# Patient Record
Sex: Female | Born: 1970 | Race: Black or African American | Hispanic: No | Marital: Single
Health system: Northeastern US, Academic
[De-identification: ages and names within clinical notes are randomized; demographics above are authoritative.]

## PROBLEM LIST (undated history)

## (undated) DIAGNOSIS — S0993XA Unspecified injury of face, initial encounter: Secondary | ICD-10-CM

## (undated) DIAGNOSIS — D219 Benign neoplasm of connective and other soft tissue, unspecified: Secondary | ICD-10-CM

## (undated) DIAGNOSIS — I10 Essential (primary) hypertension: Secondary | ICD-10-CM

---

## 2014-10-03 ENCOUNTER — Encounter (HOSPITAL_COMMUNITY): Payer: Self-pay

## 2014-10-03 ENCOUNTER — Emergency Department (INDEPENDENT_AMBULATORY_CARE_PROVIDER_SITE_OTHER)
Admission: EM | Admit: 2014-10-03 | Discharge: 2014-10-03 | Disposition: A | Discharge status: Home / Self Care | Payer: 59 | Source: Home / Self Care | Attending: Family Medicine | Admitting: Family Medicine

## 2014-10-03 DIAGNOSIS — K047 Periapical abscess without sinus: Secondary | ICD-10-CM | POA: Diagnosis not present

## 2014-10-03 DIAGNOSIS — M25562 Pain in left knee: Secondary | ICD-10-CM | POA: Diagnosis not present

## 2014-10-03 DIAGNOSIS — I10 Essential (primary) hypertension: Secondary | ICD-10-CM

## 2014-10-03 HISTORY — DX: Essential (primary) hypertension: I10

## 2014-10-03 HISTORY — DX: Benign neoplasm of connective and other soft tissue, unspecified: D21.9

## 2014-10-03 HISTORY — DX: Unspecified injury of face, initial encounter: S09.93XA

## 2014-10-03 LAB — POCT I-STAT, CHEM 8
BUN: 6 mg/dL (ref 6–20)
CALCIUM ION: 1.18 mmol/L (ref 1.12–1.23)
CHLORIDE: 103 mmol/L (ref 101–111)
Creatinine, Ser: 0.8 mg/dL (ref 0.44–1.00)
GLUCOSE: 90 mg/dL (ref 65–99)
HCT: 38 % (ref 36.0–46.0)
Hemoglobin: 12.9 g/dL (ref 12.0–15.0)
POTASSIUM: 3.8 mmol/L (ref 3.5–5.1)
SODIUM: 141 mmol/L (ref 135–145)
TCO2: 23 mmol/L (ref 0–100)

## 2014-10-03 MED ORDER — FLUCONAZOLE 150 MG PO TABS: 150.0000 mg | ORAL_TABLET | Freq: Every day | ORAL | Status: AC

## 2014-10-03 MED ORDER — HYDROCHLOROTHIAZIDE 12.5 MG PO TABS: 12.5000 mg | ORAL_TABLET | Freq: Every day | ORAL | Status: AC

## 2014-10-03 MED ORDER — CLINDAMYCIN HCL 300 MG PO CAPS: 300.0000 mg | ORAL_CAPSULE | Freq: Three times a day (TID) | ORAL | Status: AC

## 2014-10-03 MED ORDER — MELOXICAM 15 MG PO TABS: 7.5000 mg | ORAL_TABLET | Freq: Every day | ORAL | Status: AC

## 2014-10-03 NOTE — Discharge Instructions (Signed)
Please take the antibiotics as prescribed Please use the yeast medicine if it gives you a yeast infection Please use the meloxicam for knee pain and dental pain Please use a knee brace Please start the blood pressure medication
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
## 2014-10-03 NOTE — ED Notes (Signed)
C/o pain in tooth x 1 week; pain and swelling left knee x 1 week. States recently relocated from [place] . Off of her BP medication

## 2014-11-05 ENCOUNTER — Emergency Department (HOSPITAL_COMMUNITY)
Admission: EM | Admit: 2014-11-05 | Discharge: 2014-11-05 | Disposition: A | Discharge status: Home / Self Care | Payer: 59 | Source: Home / Self Care | Attending: Family Medicine | Admitting: Family Medicine

## 2014-11-05 ENCOUNTER — Encounter (HOSPITAL_COMMUNITY): Payer: Self-pay | Admitting: *Deleted

## 2014-11-05 DIAGNOSIS — I10 Essential (primary) hypertension: Secondary | ICD-10-CM | POA: Diagnosis not present

## 2014-11-05 MED ORDER — LISINOPRIL-HYDROCHLOROTHIAZIDE 10-12.5 MG PO TABS: 1.0000 | ORAL_TABLET | Freq: Every day | ORAL | Status: AC

## 2014-11-05 NOTE — ED Notes (Signed)
Pt   Reports  She has  Ran out of     Her blood   Pressure         sev  Days  Ago   She  Reports  She  Took      Her   Last  Pill  Today        She  Also  Reports  l       Arm s  Sore  As    Well
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
## 2014-11-05 NOTE — Discharge Instructions (Signed)
See your doctor for bp follow-up and refill of medicine.

## 2014-11-19 ENCOUNTER — Other Ambulatory Visit: Payer: Self-pay | Admitting: Family Medicine

## 2014-11-19 DIAGNOSIS — N63 Unspecified lump in unspecified breast: Secondary | ICD-10-CM

## 2014-11-26 ENCOUNTER — Ambulatory Visit
Admission: RE | Admit: 2014-11-26 | Discharge: 2014-11-26 | Disposition: A | Discharge status: Home / Self Care | Payer: 59 | Source: Ambulatory Visit | Attending: Family Medicine | Admitting: Family Medicine

## 2014-11-26 ENCOUNTER — Other Ambulatory Visit: Payer: Self-pay | Admitting: Family Medicine

## 2014-11-26 DIAGNOSIS — N63 Unspecified lump in unspecified breast: Secondary | ICD-10-CM

## 2015-11-23 ENCOUNTER — Other Ambulatory Visit: Payer: Self-pay | Admitting: Family Medicine

## 2015-11-23 DIAGNOSIS — N926 Irregular menstruation, unspecified: Secondary | ICD-10-CM

## 2015-11-23 DIAGNOSIS — D259 Leiomyoma of uterus, unspecified: Secondary | ICD-10-CM

## 2015-12-02 ENCOUNTER — Other Ambulatory Visit: Payer: 59

## 2015-12-07 ENCOUNTER — Ambulatory Visit
Admission: RE | Admit: 2015-12-07 | Discharge: 2015-12-07 | Disposition: A | Discharge status: Home / Self Care | Payer: 59 | Source: Ambulatory Visit | Attending: Family Medicine | Admitting: Family Medicine

## 2015-12-07 DIAGNOSIS — D259 Leiomyoma of uterus, unspecified: Secondary | ICD-10-CM

## 2015-12-07 DIAGNOSIS — N926 Irregular menstruation, unspecified: Secondary | ICD-10-CM

## 2015-12-21 ENCOUNTER — Other Ambulatory Visit: Payer: Self-pay | Admitting: Family Medicine

## 2015-12-21 DIAGNOSIS — N83202 Unspecified ovarian cyst, left side: Secondary | ICD-10-CM

## 2016-02-01 ENCOUNTER — Other Ambulatory Visit: Payer: 59

## 2016-02-29 ENCOUNTER — Other Ambulatory Visit: Payer: 59

## 2016-02-29 ENCOUNTER — Inpatient Hospital Stay: Admission: RE | Admit: 2016-02-29 | Payer: 59 | Source: Ambulatory Visit

## 2016-03-24 ENCOUNTER — Ambulatory Visit
Admission: RE | Admit: 2016-03-24 | Discharge: 2016-03-24 | Disposition: A | Discharge status: Home / Self Care | Payer: 59 | Source: Ambulatory Visit | Attending: Family Medicine | Admitting: Family Medicine

## 2016-03-24 ENCOUNTER — Other Ambulatory Visit: Payer: Self-pay | Admitting: Family Medicine

## 2016-03-24 DIAGNOSIS — M792 Neuralgia and neuritis, unspecified: Secondary | ICD-10-CM

## 2016-03-30 ENCOUNTER — Ambulatory Visit
Admission: RE | Admit: 2016-03-30 | Discharge: 2016-03-30 | Disposition: A | Discharge status: Home / Self Care | Payer: 59 | Source: Ambulatory Visit | Attending: Family Medicine | Admitting: Family Medicine

## 2016-03-30 DIAGNOSIS — N83202 Unspecified ovarian cyst, left side: Secondary | ICD-10-CM

## 2017-06-19 IMAGING — US US TRANSVAGINAL NON-OB
1 series · 13 of 25 positions shown · non-contrast
Comparison: 12/07/2015

CLINICAL DATA: Prolonged heavy bleeding for 2 months. On birth
control pills. Left ovarian cyst. Fibroids.



[Series 1: us transvaginal non-ob · 0.25mm/px · 13 of 27 slices shown]
[im 1/27]
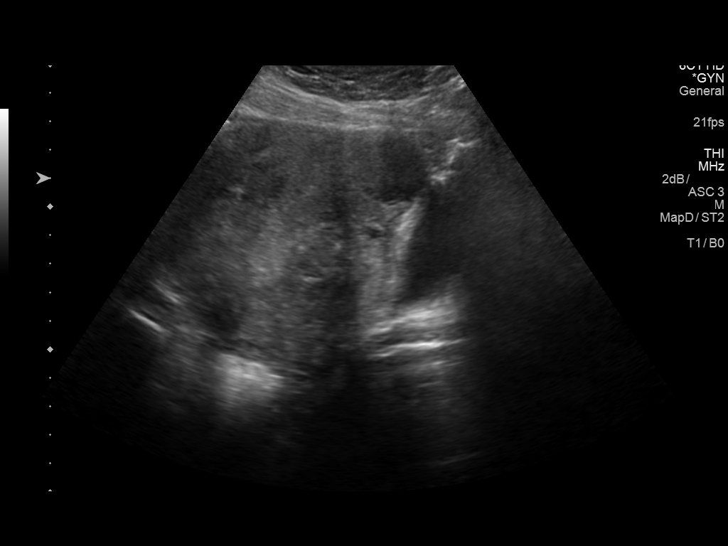
[im 3/27]
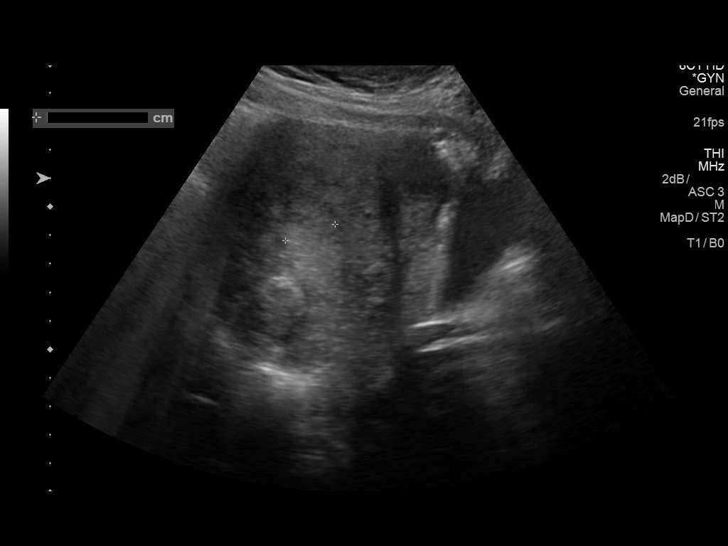
[im 5/27]
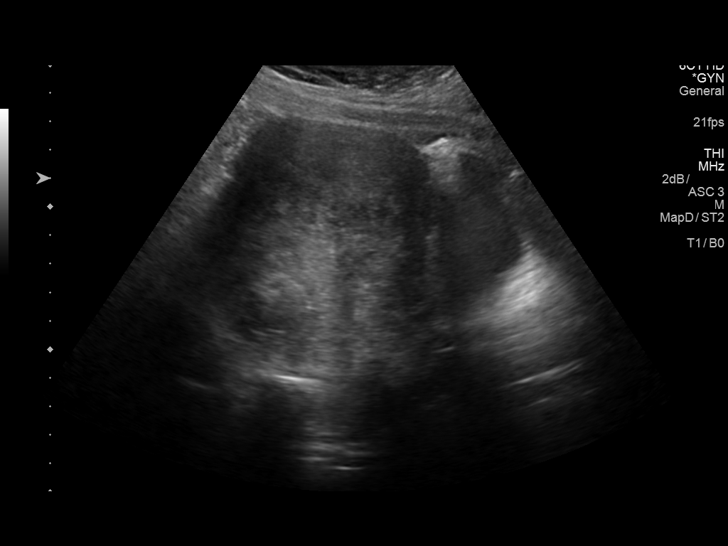
[im 7/27]
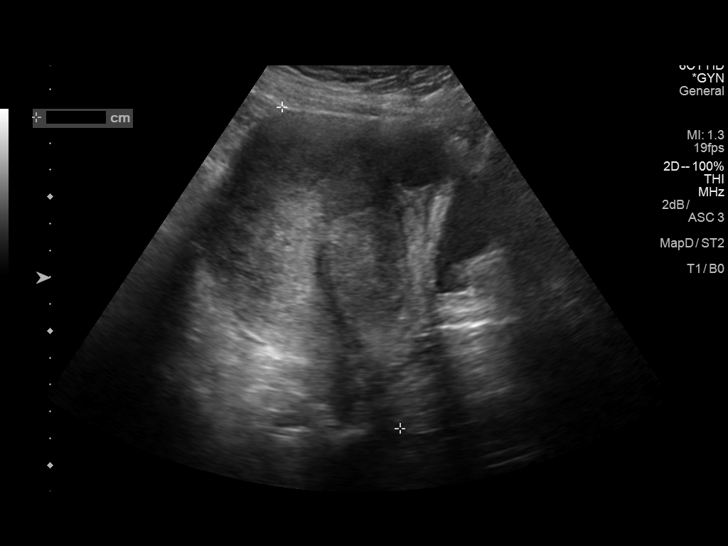
[im 9/27]
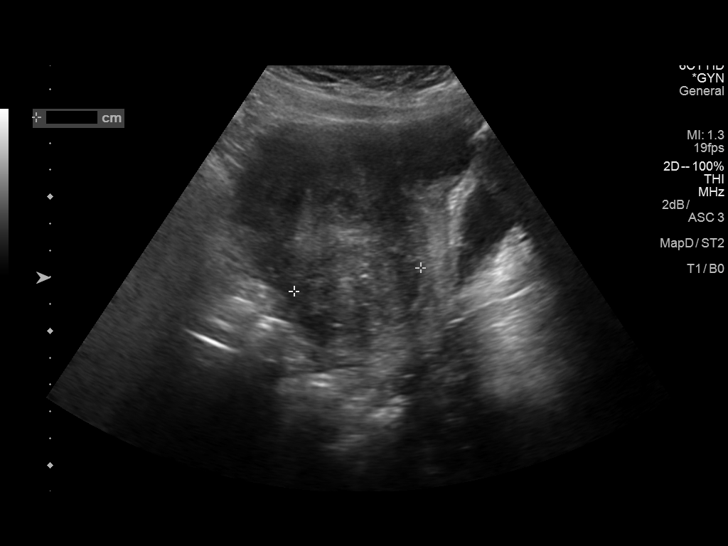
[im 11/27]
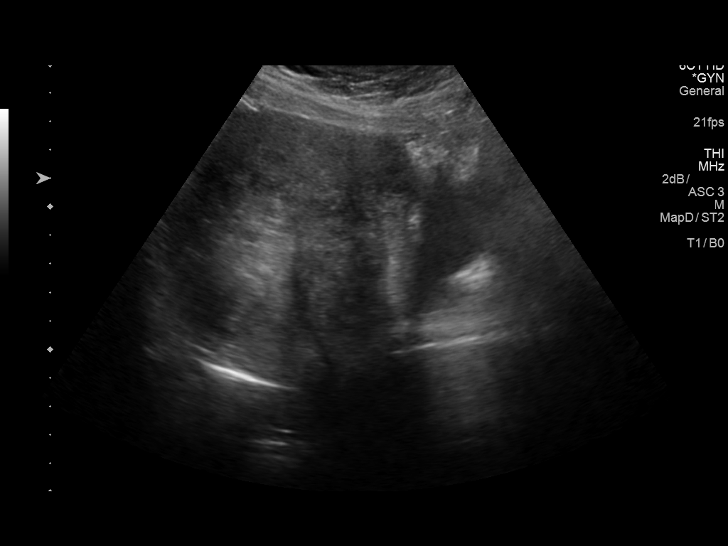
[im 14/27]
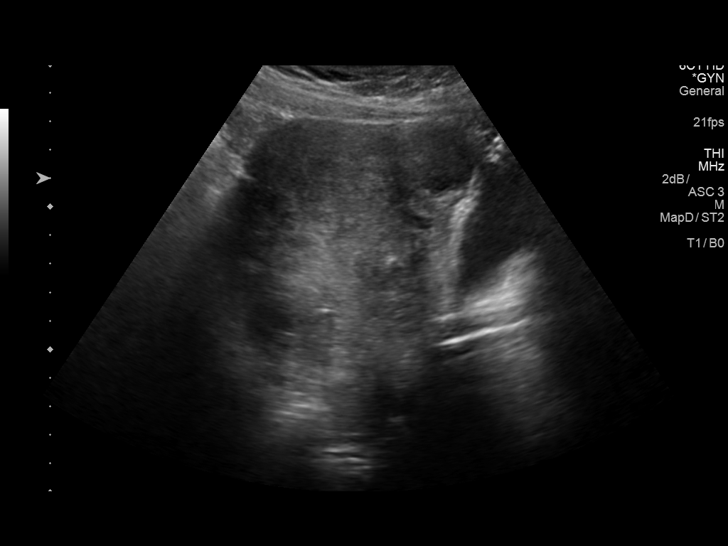
[im 16/27]
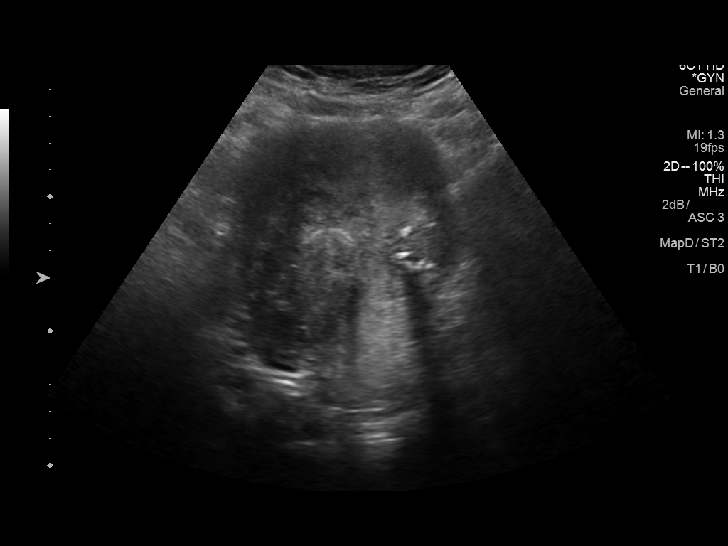
[im 18/27]
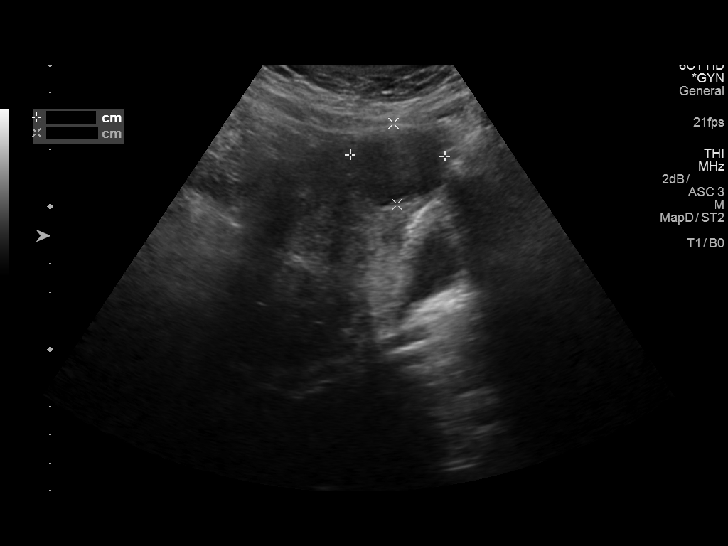
[im 20/27]
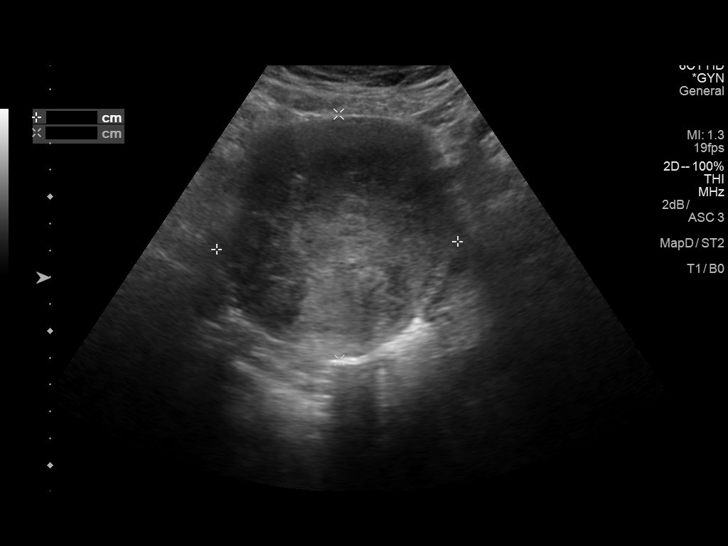
[im 22/27]
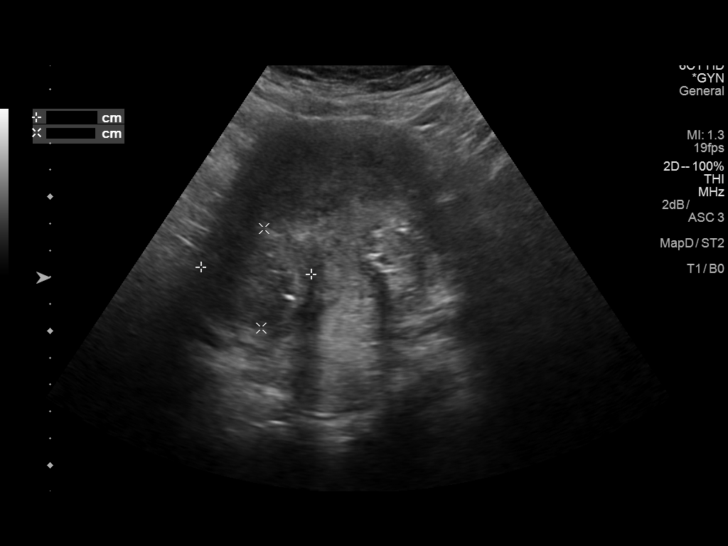
[im 24/27]
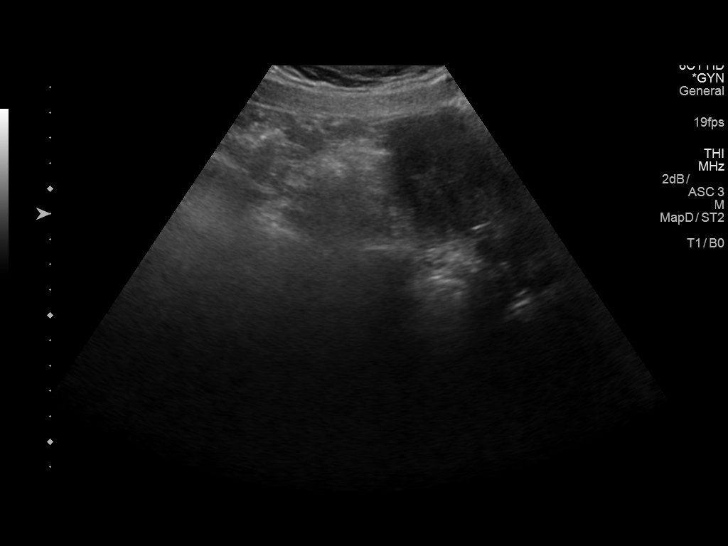
[im 27/27]
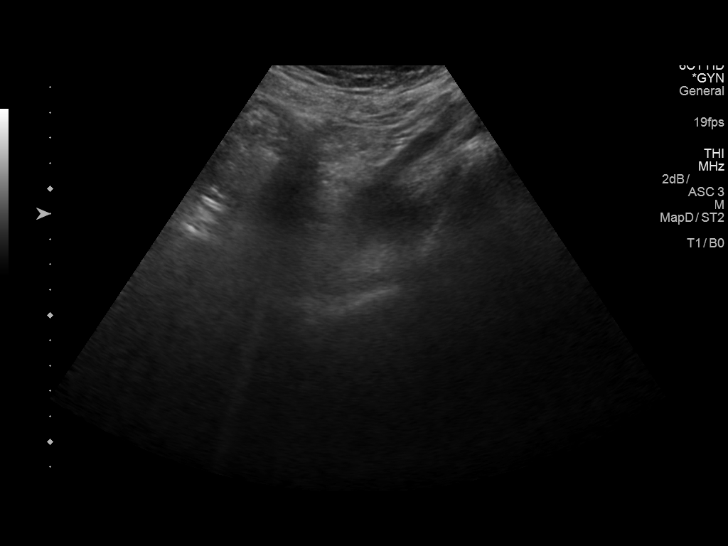

[13 of 25 positions shown; findings below may reference images not displayed]

FINDINGS: Uterus

Measurements: 13 x 9 x 9 cm. Enlargement is due to multiple
heterogeneous masses consistent with fibroids. Most of these are
intramural, and measure up to 4 cm in the posterior body. There is a
mucosal based mass measuring up to 25 mm. Although there is central
vascularity, echotexture with shadowing focus favors fibroid over
polyp.

Endometrium

Most of the endometrium is distorted by a fibroids. Where not
thicken by the above-described mucosal based mass there is no
generalized thickening. Endometrium measures up to 8 mm.

Right ovary

Measurements: 24 x 13 x 15 mm.  Normal appearance.

Left ovary

Measurements: 32 x 21 x 24 mmHemorrhagic cyst seen previously has
resolved/simplified. There is a thick walled 20 x 14 mm cyst/
follicle in the left ovary most consistent with a corpus luteum. No
evidence of solid mass.

Other findings

No abnormal free fluid.
IMPRESSION: 1. Enlarged multi fibroid uterus. 25 mm mucosal based mass has
features favoring submucosal fibroid over polyp.
2. Resolved left ovarian hemorrhagic cyst. Left-sided cyst/follicle
is likely a corpus luteum.

## 2017-06-19 IMAGING — US US TRANSVAGINAL NON-OB
1 series · 13 of 25 positions shown · non-contrast
Comparison: 12/07/2015

CLINICAL DATA: Prolonged heavy bleeding for 2 months. On birth
control pills. Left ovarian cyst. Fibroids.



[Series 1: us transvaginal non-ob · 0.16mm/px · 13 of 52 slices shown]
[im 1/52]
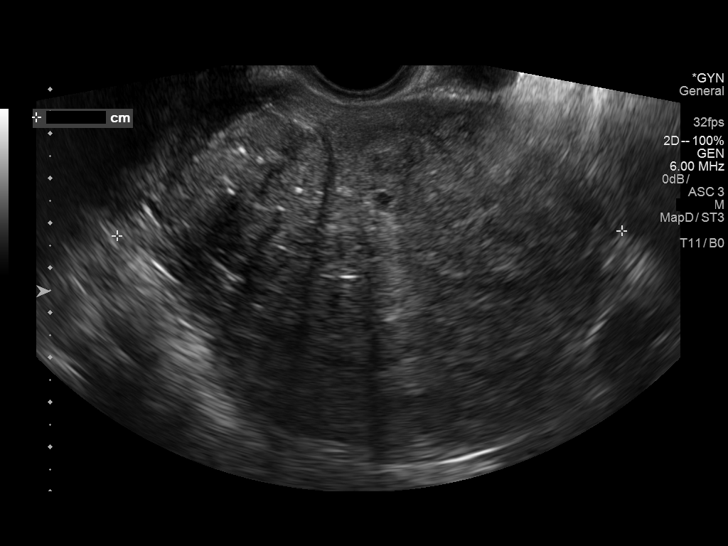
[im 5/52]
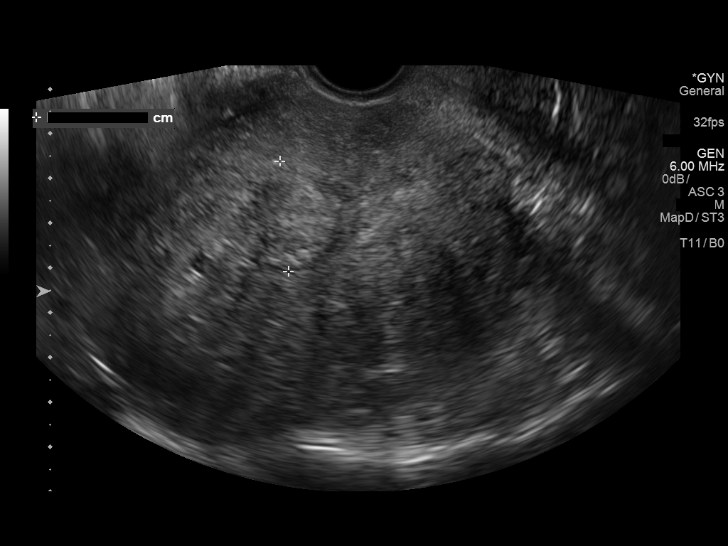
[im 9/52]
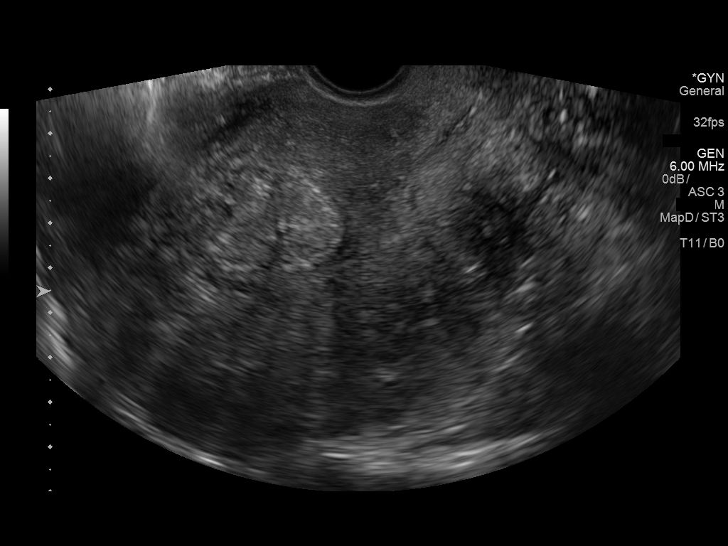
[im 13/52]
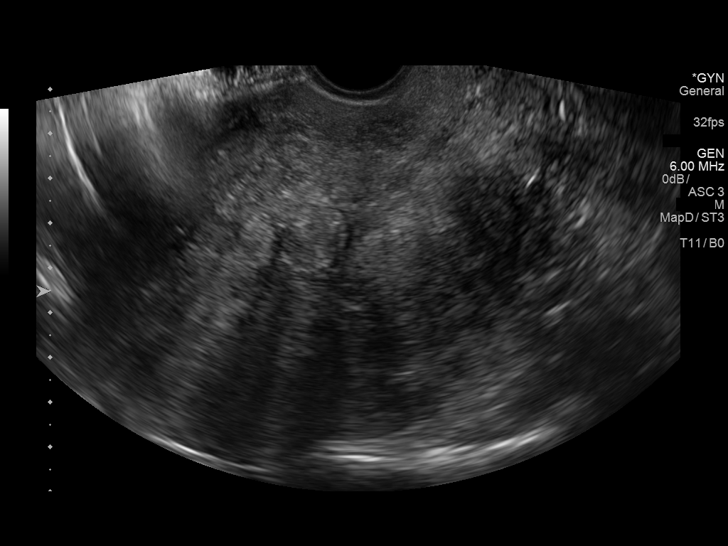
[im 18/52]
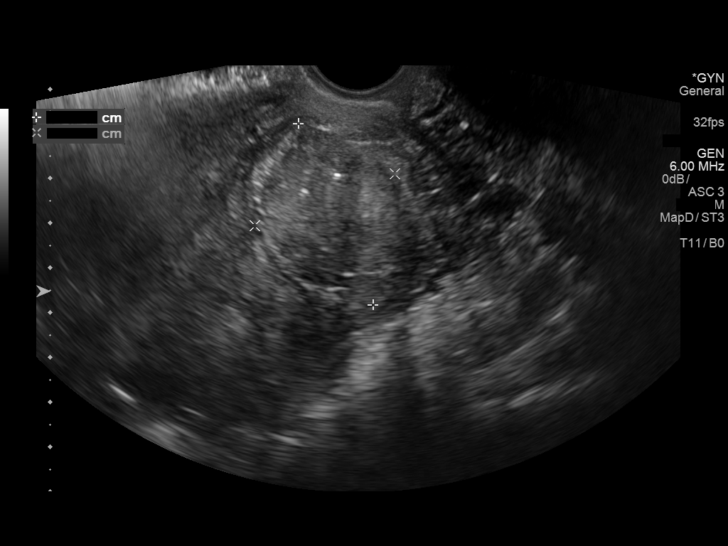
[im 22/52]
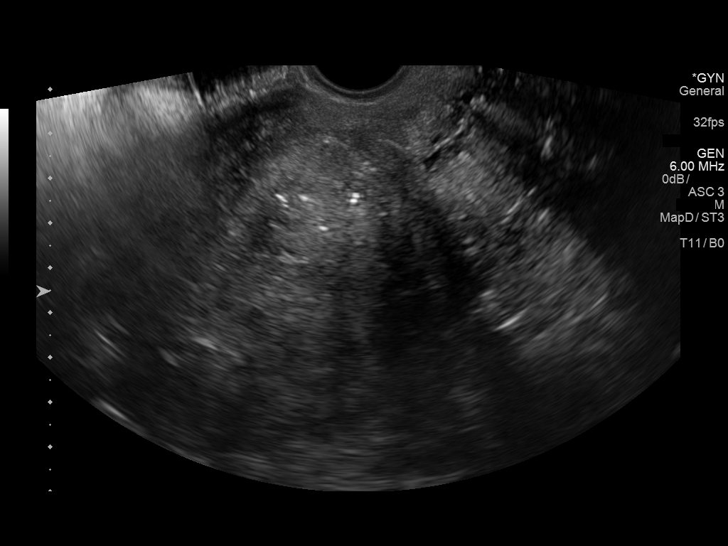
[im 26/52]
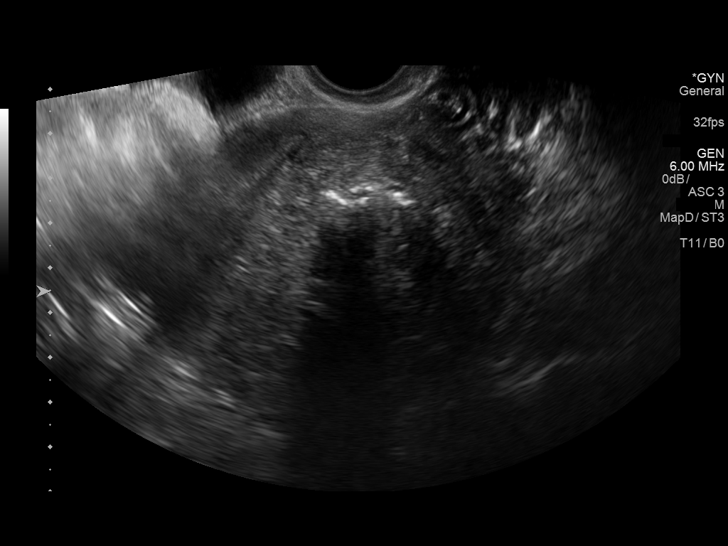
[im 30/52]
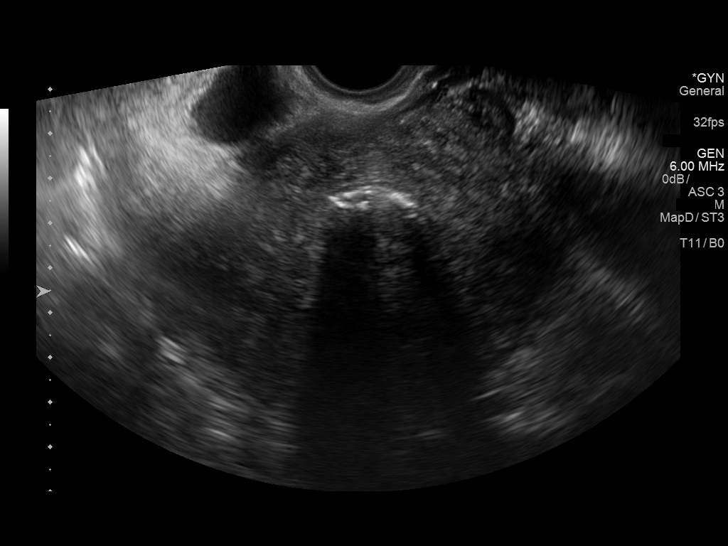
[im 35/52]
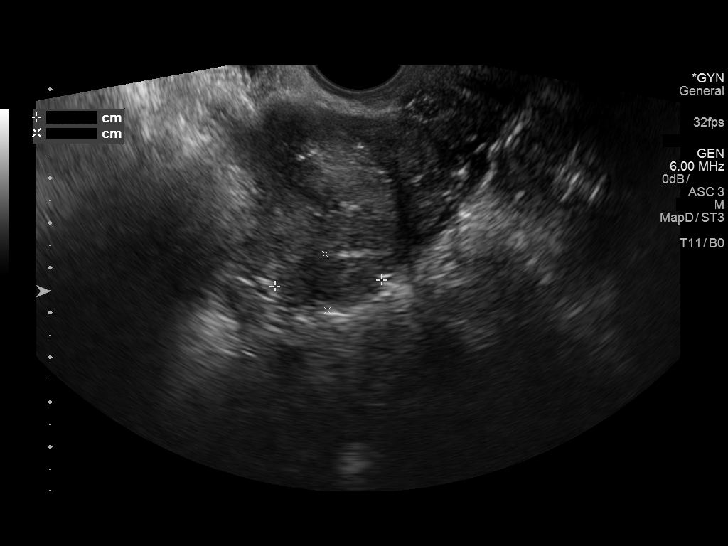
[im 39/52]
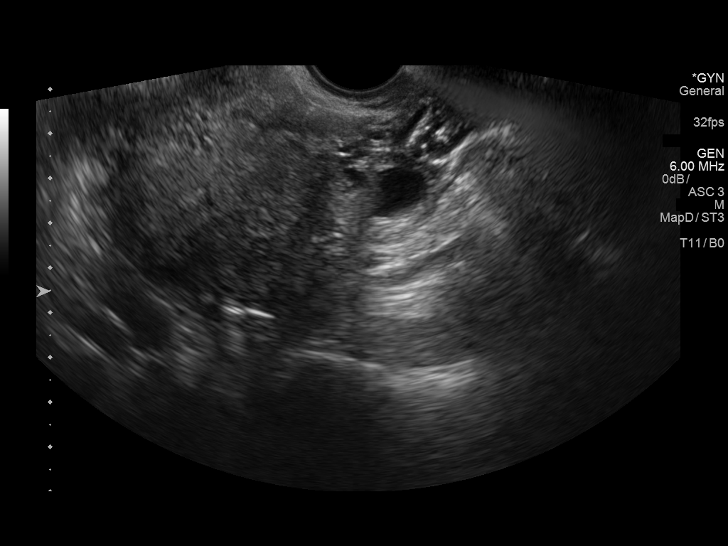
[im 43/52]
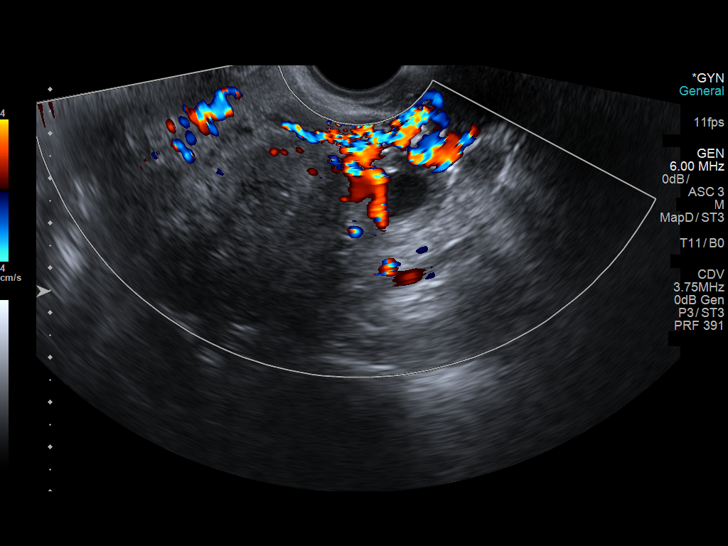
[im 47/52]
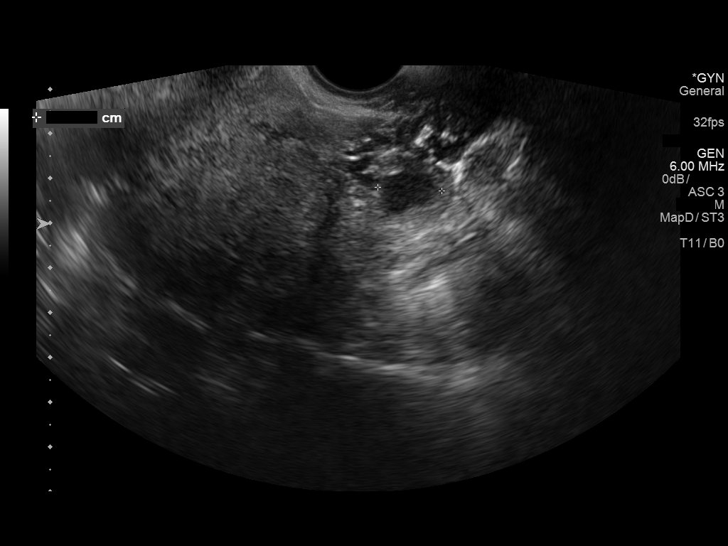
[im 52/52]
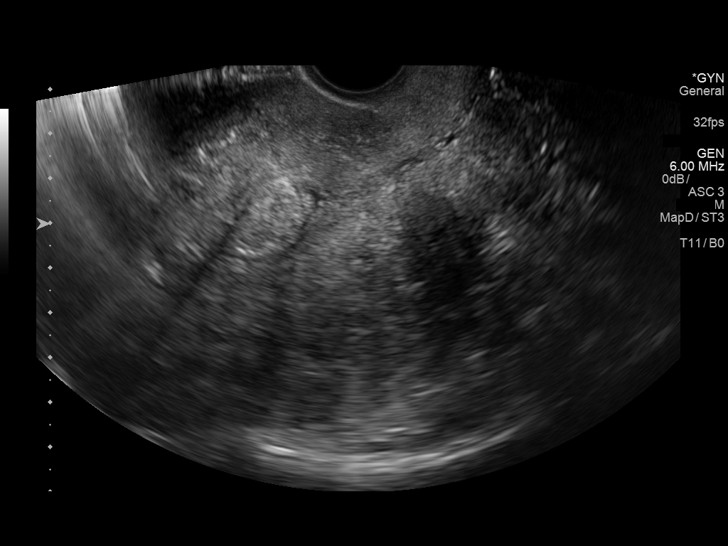

[13 of 25 positions shown; findings below may reference images not displayed]

FINDINGS: Uterus

Measurements: 13 x 9 x 9 cm. Enlargement is due to multiple
heterogeneous masses consistent with fibroids. Most of these are
intramural, and measure up to 4 cm in the posterior body. There is a
mucosal based mass measuring up to 25 mm. Although there is central
vascularity, echotexture with shadowing focus favors fibroid over
polyp.

Endometrium

Most of the endometrium is distorted by a fibroids. Where not
thicken by the above-described mucosal based mass there is no
generalized thickening. Endometrium measures up to 8 mm.

Right ovary

Measurements: 24 x 13 x 15 mm.  Normal appearance.

Left ovary

Measurements: 32 x 21 x 24 mmHemorrhagic cyst seen previously has
resolved/simplified. There is a thick walled 20 x 14 mm cyst/
follicle in the left ovary most consistent with a corpus luteum. No
evidence of solid mass.

Other findings

No abnormal free fluid.
IMPRESSION: 1. Enlarged multi fibroid uterus. 25 mm mucosal based mass has
features favoring submucosal fibroid over polyp.
2. Resolved left ovarian hemorrhagic cyst. Left-sided cyst/follicle
is likely a corpus luteum.

## 2020-09-06 ENCOUNTER — Emergency Department: Admit: 2020-09-06 | Payer: PRIVATE HEALTH INSURANCE

## 2020-09-06 ENCOUNTER — Inpatient Hospital Stay: Admit: 2020-09-06 | Discharge: 2020-09-06 | Payer: PRIVATE HEALTH INSURANCE

## 2020-09-06 DIAGNOSIS — M25561 Pain in right knee: Secondary | ICD-10-CM

## 2020-09-06 DIAGNOSIS — M542 Cervicalgia: Principal | ICD-10-CM

## 2020-09-06 DIAGNOSIS — J45909 Unspecified asthma, uncomplicated: Secondary | ICD-10-CM

## 2020-09-06 DIAGNOSIS — Z87891 Personal history of nicotine dependence: Secondary | ICD-10-CM

## 2020-09-06 DIAGNOSIS — E7800 Pure hypercholesterolemia, unspecified: Secondary | ICD-10-CM

## 2020-09-06 DIAGNOSIS — S161XXA Strain of muscle, fascia and tendon at neck level, initial encounter: Secondary | ICD-10-CM

## 2020-09-06 DIAGNOSIS — I10 Essential (primary) hypertension: Secondary | ICD-10-CM

## 2020-09-06 DIAGNOSIS — M069 Rheumatoid arthritis, unspecified: Secondary | ICD-10-CM

## 2020-09-06 DIAGNOSIS — Z791 Long term (current) use of non-steroidal anti-inflammatories (NSAID): Secondary | ICD-10-CM

## 2020-09-06 DIAGNOSIS — M25562 Pain in left knee: Secondary | ICD-10-CM

## 2020-09-06 DIAGNOSIS — Z79899 Other long term (current) drug therapy: Secondary | ICD-10-CM

## 2020-09-06 MED ORDER — CYCLOBENZAPRINE 10 MG TABLET
10 mg | ORAL_TABLET | Freq: Two times a day (BID) | ORAL | 1 refills | Status: AC | PRN
Start: 2020-09-06 — End: ?

## 2020-09-06 NOTE — Discharge Instructions
Return if worse
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
## 2021-09-16 ENCOUNTER — Emergency Department: Admit: 2021-09-16 | Payer: PRIVATE HEALTH INSURANCE

## 2021-09-16 ENCOUNTER — Inpatient Hospital Stay: Admit: 2021-09-16 | Discharge: 2021-09-16 | Payer: PRIVATE HEALTH INSURANCE | Attending: Emergency Medicine

## 2021-09-16 DIAGNOSIS — M79605 Pain in left leg: Secondary | ICD-10-CM

## 2021-09-16 MED ORDER — ACETAMINOPHEN 325 MG TABLET
325 mg | Freq: Once | ORAL | Status: DC
Start: 2021-09-16 — End: 2021-09-17

## 2021-09-16 MED ORDER — ACETAMINOPHEN 325 MG TABLET
325 mg | Freq: Once | ORAL | Status: CP
Start: 2021-09-16 — End: ?
  Administered 2021-09-17: 01:00:00 325 mg via ORAL

## 2021-09-17 DIAGNOSIS — Z881 Allergy status to other antibiotic agents status: Secondary | ICD-10-CM

## 2021-09-17 DIAGNOSIS — Z880 Allergy status to penicillin: Secondary | ICD-10-CM

## 2021-09-17 DIAGNOSIS — Z86718 Personal history of other venous thrombosis and embolism: Secondary | ICD-10-CM

## 2021-09-17 DIAGNOSIS — I10 Essential (primary) hypertension: Secondary | ICD-10-CM

## 2021-09-17 DIAGNOSIS — Z79899 Other long term (current) drug therapy: Secondary | ICD-10-CM

## 2021-09-17 DIAGNOSIS — Z888 Allergy status to other drugs, medicaments and biological substances status: Secondary | ICD-10-CM

## 2021-09-17 DIAGNOSIS — M79604 Pain in right leg: Secondary | ICD-10-CM

## 2021-09-17 NOTE — ED Notes
6:46 PM Pt presents to ED w/ leg cramping.  Pt reports 3 days of LLE cramping, mainly in the popliteal area.  Pt reports a hx of blood clots, reports this feeling is similar.  Pt states she has intermittent pain while ambulating and last night was woken up in the middle of night d/t intolerable aching pain.  Pt is not on blood thinners.  Denies chest pain and SOB.Past Medical History: Diagnosis Date  Asthma   Last attack was when she was 78. She takes albuterol prn.  Hypercholesteremia   Hypertension   Radiculopathy   Rheumatoid arthritis (HC Code)   Rheumatoid arthritis (HC Code)   Rheumatoid arthritis involving ankle, unspecified laterality, unspecified rheumatoid factor presence   Rheumatoid arthritis involving ankle, unspecified laterality, unspecified rheumatoid factor presence   Thrombophlebitis   Uterine fibroid  9:00 PM Pt resting in stretcher.  Reporting of pain behind R knee.  Medicated for pain per MAR.  Denies additional complaints at this time.  Pending [place].10:40 PM [place] completed.  Pt cleared for discharge by provider.  Pt reports improvement in pain.  Vitals obtained.  Discharge papers and instructions given by this RN.  Ambulatory w/ steady gait upon discharge.

## 2021-09-17 NOTE — Discharge Instructions
We did not find a blood clot in your legs. If you have continued pain and swelling you can follow up with a primary physician. Since there is no primary listed on your file, we placed an order for a referral and you should receive a call to coordinate a primary healthcare worker for you.

## 2021-09-17 NOTE — ED Provider Notes
Chief Complaint Patient presents with ? Leg Pain   L leg pain and swelling x 2 days.  Hx of clots in her legs, thinks she has another.  Not on thinners.    Hypertensive at triage, off meds for HTN. HPI/PE:51 y/o female w history of DVT presents to ED with right LE pain. Denies SOB, fever, chills. Has history of DVT, denies recent travel/immobilization. Mild swelling in popliteal fossa w mild ttpDiff dx includes baker cyst vs DVT, will obtain right LE [place].   Physical ExamED Triage Vitals [09/16/21 1828]BP: (!) 171/114Pulse: 89Pulse from  O2 sat: n/aResp: 18Temp: 98.3 ?F (36.8 ?C)Temp src: TemporalSpO2: 98 % BP (!) 171/114  - Pulse 89  - Temp 98.3 ?F (36.8 ?C) (Temporal)  - Resp 18  - SpO2 98% Physical ExamConstitutional:     General: She is not in acute distress.   Appearance: Normal appearance. HENT:    Head: Normocephalic and atraumatic. Eyes:    Conjunctiva/sclera: Conjunctivae normal. Cardiovascular:    Rate and Rhythm: Normal rate. Pulmonary:    Effort: Pulmonary effort is normal. No respiratory distress.    Breath sounds: Normal breath sounds. Abdominal:    General: There is no distension. Musculoskeletal:       General: No deformity. Skin:   General: Skin is warm and dry. Neurological:    Mental Status: She is alert. Psychiatric:       Behavior: Behavior normal.  ProceduresAttestation/Critical CarePatient Reevaluation: 10:33 PMInitially left DVT US was ordered by mistake. Right LE DVT study is negative. Will discharge from ED. Sherrilyn Rist, MDClinical Impressions as of 09/16/21 2235 Right leg swelling  ED DispositionDischarge Sherrilyn Rist, MD09/14/23 2235

## 2021-09-17 NOTE — Telephone Encounter
Message from ED provider requesting Zoe Riddle be contacted and assisted with establishing primary care.  Spoke with her and she was provided with the contact # for the [facility] as she is uninsured and is not eligible for [facility] at this time (reported by [name]). She was also encouraged to complete the Free Care application. All questions were answered and she was appreciative of the call.

## 2022-05-10 ENCOUNTER — Emergency Department: Admit: 2022-05-10 | Payer: PRIVATE HEALTH INSURANCE

## 2022-05-10 ENCOUNTER — Encounter: Admit: 2022-05-10 | Payer: PRIVATE HEALTH INSURANCE | Attending: Emergency Medicine

## 2022-05-10 ENCOUNTER — Inpatient Hospital Stay: Admit: 2022-05-10 | Discharge: 2022-05-10 | Payer: PRIVATE HEALTH INSURANCE | Attending: Emergency Medicine

## 2022-05-10 DIAGNOSIS — I10 Essential (primary) hypertension: Secondary | ICD-10-CM

## 2022-05-10 DIAGNOSIS — E7800 Pure hypercholesterolemia, unspecified: Secondary | ICD-10-CM

## 2022-05-10 DIAGNOSIS — R42 Dizziness and giddiness: Secondary | ICD-10-CM

## 2022-05-10 DIAGNOSIS — I2102 ST elevation (STEMI) myocardial infarction involving left anterior descending coronary artery: Secondary | ICD-10-CM

## 2022-05-10 DIAGNOSIS — I809 Phlebitis and thrombophlebitis of unspecified site: Secondary | ICD-10-CM

## 2022-05-10 DIAGNOSIS — M5410 Radiculopathy, site unspecified: Secondary | ICD-10-CM

## 2022-05-10 DIAGNOSIS — D259 Leiomyoma of uterus, unspecified: Secondary | ICD-10-CM

## 2022-05-10 DIAGNOSIS — M069 Rheumatoid arthritis, unspecified: Secondary | ICD-10-CM

## 2022-05-10 DIAGNOSIS — J45909 Unspecified asthma, uncomplicated: Secondary | ICD-10-CM

## 2022-05-10 LAB — BASIC METABOLIC PANEL
BKR AMPHETAMINE SCREEN, URINE, NO CONF.: >60 mL/min/1.73m2 (ref >=60)
BKR ANION GAP: 10 (ref 7–17)
BKR BENZODIAZEPINES SCREEN, URINE, NO CONF.: 23 mmol/L (ref 20–30)
BKR BLOOD UREA NITROGEN: 12 mg/dL (ref 6–20)
BKR BUN / CREAT RATIO: 15.6 (ref 8.0–23.0)
BKR CALCIUM: 9.4 mg/dL (ref 8.8–10.2)
BKR CHLORIDE: 105 mmol/L (ref 98–107)
BKR CO2: 23 mmol/L (ref 20–30)
BKR CREATININE: 0.77 mg/dL (ref 0.40–1.30)
BKR EGFR, CREATININE (CKD-EPI 2021): >60 mL/min/{1.73_m2} (ref >=60)
BKR GLUCOSE: 106 mg/dL — ABNORMAL HIGH (ref 70–100)
BKR PHENCYCLIDINE (PCP) SCREEN, URINE, NO CONF.: 15.6 (ref 8.0–23.0)
BKR POTASSIUM: 3.9 mmol/L (ref 3.3–5.3)
BKR SODIUM: 138 mmol/L (ref 136–144)

## 2022-05-10 LAB — CBC WITHOUT DIFFERENTIAL
BKR WAM ANALYZER ANC: 3.21 x 1000/??L (ref 2.00–7.60)
BKR WAM HEMATOCRIT (2 DEC): 41.2 % (ref 35.00–45.00)
BKR WAM HEMOGLOBIN: 13.3 g/dL (ref 11.7–15.5)
BKR WAM MCH (PG): 26.3 pg — ABNORMAL LOW (ref 27.0–33.0)
BKR WAM MCHC: 32.3 g/dL (ref 31.0–36.0)
BKR WAM MCV: 81.4 fL (ref 80.0–100.0)
BKR WAM MPV: 9.5 fL (ref 8.0–12.0)
BKR WAM PLATELETS: 343 x1000/??L (ref 150–420)
BKR WAM RDW-CV: 13.6 % (ref 11.0–15.0)
BKR WAM RED BLOOD CELL COUNT.: 5.06 M/ÂµL (ref 4.00–6.00)
BKR WAM WHITE BLOOD CELL COUNT: 6.6 x1000/ÂµL (ref 4.0–11.0)

## 2022-05-10 LAB — TROPONIN T HIGH SENSITIVITY, 1 HOUR WITH REFLEX (BH GH LMW YH)
BKR TROPONIN T HS 1 HOUR DELTA FROM 0 HOUR: 1 ng/L (ref 5.5–7.5)
BKR TROPONIN T HS 1 HOUR: 8 ng/L

## 2022-05-10 LAB — URINE DRUG SCREEN W/ NO CONF   (BH GH LMH YH)
BKR BARBITURATE SCREEN, URINE, NO CONF.: NEGATIVE
BKR CANNABINOIDS SCREEN, URINE, NO CONF.: NEGATIVE
BKR COCAINE SCREEN, URINE, NO CONF.: NEGATIVE
BKR METHADONE METABOLITE SCREEN, URINE, NO CONF.: NEGATIVE
BKR OPIATES SCREEN, URINE, NO CONF.: NEGATIVE
BKR OXYCODONE SCREEN, URINE, NO CONF.: NEGATIVE

## 2022-05-10 LAB — PROTIME AND INR
BKR INR: 1.04 (ref 0.86–1.12)
BKR PROTHROMBIN TIME: 11.5 s (ref 9.6–12.3)

## 2022-05-10 LAB — PARTIAL THROMBOPLASTIN TIME     (BH GH LMW Q YH): BKR PARTIAL THROMBOPLASTIN TIME: 22.5 seconds — ABNORMAL LOW (ref 23.0–31.4)

## 2022-05-10 LAB — UA REFLEX CULTURE

## 2022-05-10 LAB — URINALYSIS WITH CULTURE REFLEX      (BH LMW YH)
BKR BILIRUBIN, UA: NEGATIVE
BKR BLOOD, UA: NEGATIVE
BKR GLUCOSE, UA: NEGATIVE
BKR KETONES, UA: NEGATIVE
BKR LEUKOCYTE ESTERASE, UA: NEGATIVE
BKR NITRITE, UA: NEGATIVE
BKR PH, UA: 7.5 (ref 5.5–7.5)
BKR PROTEIN, UA: NEGATIVE
BKR SPECIFIC GRAVITY, UA: 1.04 ng/L — ABNORMAL HIGH (ref 1.005–1.030)
BKR UROBILINOGEN, UA (MG/DL): <2 mg/dL (ref <=2.0)

## 2022-05-10 LAB — TROPONIN T HIGH SENSITIVITY, 0 HOUR BASELINE WITH REFLEX (BH GH LMW YH): BKR TROPONIN T HS 0 HOUR BASELINE: 7 ng/L

## 2022-05-10 LAB — BUPRENORPHINE, URINE, WITH NO CONFIRMATION   (BH GH LMW YH): BKR BUPRENORPHINE SCREEN: NEGATIVE

## 2022-05-10 LAB — FENTANYL, URINE, WITH NO CONFIRMATION (BH GH L LMW YH)
BKR DRUGS OF ABUSE NOTE: NEGATIVE mg/dL — ABNORMAL HIGH (ref 70–100)
BKR FENTANYL: NEGATIVE (ref 7–17)

## 2022-05-10 MED ORDER — METOPROLOL SUCCINATE ER 25 MG TABLET,EXTENDED RELEASE 24 HR
25 mg | Freq: Every day | ORAL | Status: DC
Start: 2022-05-10 — End: 2022-05-11

## 2022-05-10 MED ORDER — LISINOPRIL 20 MG TABLET
20 mg | Freq: Every day | ORAL | Status: DC
Start: 2022-05-10 — End: 2022-05-11

## 2022-05-10 MED ORDER — LORAZEPAM 0.5 MG TABLET
0.5 mg | Freq: Once | ORAL | Status: DC | PRN
Start: 2022-05-10 — End: 2022-05-10

## 2022-05-10 MED ORDER — ROSUVASTATIN 40 MG TABLET
40 mg | Freq: Every day | ORAL | Status: DC
Start: 2022-05-10 — End: 2022-05-11

## 2022-05-10 MED ORDER — SODIUM CHLORIDE 0.9 % LARGE VOLUME SYRINGE FOR AUTOINJECTOR
Freq: Once | INTRAVENOUS | Status: CP | PRN
Start: 2022-05-10 — End: ?
  Administered 2022-05-10: 15:00:00 via INTRAVENOUS

## 2022-05-10 MED ORDER — MECLIZINE 25 MG TABLET
25 mg | Freq: Once | ORAL | Status: CP
Start: 2022-05-10 — End: ?
  Administered 2022-05-11: 25 mg via ORAL

## 2022-05-10 MED ORDER — IOHEXOL 350 MG IODINE/ML INTRAVENOUS SOLUTION
350 mg iodine/mL | Freq: Once | INTRAVENOUS | Status: CP | PRN
Start: 2022-05-10 — End: ?
  Administered 2022-05-10: 15:00:00 350 mL via INTRAVENOUS

## 2022-05-10 MED ORDER — AMLODIPINE 10 MG TABLET
10 mg | Freq: Every day | ORAL | Status: DC
Start: 2022-05-10 — End: 2022-05-11

## 2022-05-10 MED ORDER — ACETAMINOPHEN 325 MG TABLET
325 mg | Freq: Once | ORAL | Status: CP
Start: 2022-05-10 — End: ?
  Administered 2022-05-10: 18:00:00 325 mg via ORAL

## 2022-05-10 MED ORDER — MECLIZINE 25 MG TABLET
25 mg | ORAL_TABLET | Freq: Three times a day (TID) | ORAL | 1 refills | Status: AC | PRN
Start: 2022-05-10 — End: ?

## 2022-05-10 MED ORDER — MIDAZOLAM 1 MG/ML INJECTION SOLUTION
1 mg/mL | Freq: Once | INTRAVENOUS | Status: CP
Start: 2022-05-10 — End: ?
  Administered 2022-05-10: 23:00:00 1 mL via INTRAVENOUS

## 2022-05-10 MED ORDER — LORAZEPAM 1 MG TABLET
1 mg | Freq: Once | ORAL | Status: DC
Start: 2022-05-10 — End: 2022-05-10

## 2022-05-10 MED ORDER — LORAZEPAM 0.5 MG TABLET
0.5 mg | Freq: Once | ORAL | Status: CP
Start: 2022-05-10 — End: ?
  Administered 2022-05-10: 22:00:00 0.5 mg via ORAL

## 2022-05-10 NOTE — ED Notes
4:34 PM Report received and care assumed for patient. Currently resting comfortably with no complaints or acute distress noted.

## 2022-05-10 NOTE — ED Notes
12:45 PM Report received from outgoing RN. Pt here for dizziness, cancelled stroke alert. LKN 11PM. L arm weakness with seduced sensation per note. Plan for MRI brain. 12:56 PM Pt states she will not be able to tolerate the MRI, pt asking to be asleep during it.

## 2022-05-10 NOTE — ED Provider Notes
Chief Complaint Patient presents with  Dizziness  Weakness RESIDENT NOTE AND MDM PRESENTATION:  Pt is a 52 y.o. female PMH hypertension rheumatoid arthritis hyperlipidemia STEMI not on AC or AP presents with dizziness.  Symptoms started last night at 11:00 p.m. abrupt onset disequilibrium stating she says she feels unbalanced when she walks she feels like she was going to fall denies room spinning sensation denies lightheadedness, no syncope no fall no head strike.  She also endorses this morning she felt like her left arm was weak with reduced sensation.Vital signs:  HypertensiveNotable exam findings:  Mild subjective reduced sensation left upper extremity no nystagmus pupils 3 mm equal reactive to light no facial droop no slurred speech anxious appearing lungs clear to auscultation 2+ radial pulses symmetric 2+ dorsalis pedis pulses symmetric following commands in upper and lower extremities speaking in complete sentencesMDM/COURSE:52 year old female hypertension hyperlipidemia prior STEMI presents with dizziness left arm pain started at 11:00 p.m. activated as a stroke alert significantly hypertensive.  Neuro resident urgently at bedside for initial evaluation based on time of onset will obtain [place] CTA [place] perfusion stroke labs ordered no chest pain no shortness of breath normal pulse exam, no recent trauma or obvious traumatic injury on examination, protecting airway hypertensive but otherwise hemodynamically stable.  Differential includes vertigo versus CVA, will obtain EKG troponins given prior ACS history.Stroke alert canceled as no role for TBI or thrombectomy.  Neuro recommends MRI.  Signed out to ops pending MRI and final neuro recommendations.Donnelly Stager, MDEmergency MedicinePGY-3---------------------------------------------------------------------------------------------------------------------ADMISSION TO ED OBSERVATIONAdmit to Observation Status: Obs date: 05/10/2022  3:15 PM  Indication for observation: Diagnostic testing (MRI, 6hr [place] etc)In addition to serial exams and monitoring of hemodynamics, other anticipated interventions include: Diagnostic testingRelevant home medications to be ordered by me and patient handoff given per protocol Discharge criteria: MRI neuro recsAn acute or life threatening problem was considered during this evaluation  A decision regarding hospitalization was made during this visit  External data reviewed: Notes (OSH or non-ED)Directly spoke with:  Consultant  Physical ExamED Triage Vitals [05/10/22 1010]BP: (!) 201/106Pulse: 64Pulse from  O2 sat: n/aResp: 18Temp: 97.9 ?F (36.6 ?C)Temp src: OralSpO2: 98 % BP (!) 178/96  - Pulse 63  - Temp 98.3 ?F (36.8 ?C) (Oral)  - Resp 18  - SpO2 99% Physical Exam ProceduresAttestation/Critical CarePatient Reevaluation: Attending Supervised: ResidentI saw and examined the patient. I agree with the findings and plan of care as documented in the resident's note. Pt with hx of RA, HTN, HL, STEMI, here with complaint of dizziness that started around 11:00 p.m. last night, sudden onset, then woke up this morning with some left arm heaviness.  Feels like she has been off balance, walking on steadily.  On exam she is well-appearing, no distress, belly soft, lungs clear, nonfocal neurologic other than some decreased sensation on the left upper extremity.  Here as a stroke code, neurology at the bedside, in for imaging, labs, dispo per results. Workup unrevealing, stroke code cancelled, plan for MRI and final neuro recs, likely d/c after that. Valita Righter GoldflamCritical care provided by attending: critical careCritical care time (minutes): 32.Critical care time was exclusive of separately billable procedures and teaching time.Critical care was necessary to treat or prevent imminent or life-threatening deterioration of the following conditions:  CNS failure or compromise.Critical care was time spent personally by me on the following activities: discussions with consultants, development of treatment plan with patient or surrogate, ordering and performing treatments and interventions, obtaining history from patient or surrogate, interpretation of cardiac  output measurements, evaluation of patient's response to treatment, examination of patient, ordering and review of laboratory studies, ordering and review of radiographic studies, pulse oximetry, re-evaluation of patient's condition and review of old charts.Clinical Impressions as of 05/10/22 1640 Dizziness  ED DispositionObservation Donnelly Stager, MDResident05/07/24 1608 Arther Dames, MD05/07/24 1640

## 2022-05-10 NOTE — ED Notes
11:11 AM - Report received, care assumed. Provider at bedside11:45 AM - pt reports improved dizziness

## 2022-05-10 NOTE — ED Notes
10:15 AM STROKE CODE from waiting room. Onset symptoms last night around 11pm. Started with dizziness, now having left arm weakness. A+OX4, speaking in clear sentences, no slurred speech. Reports she is unsteady on her feet. Neuro at bedside. 10:20 AM Pt to [place] with RN and stroke team.10:47 AM Stroke code cancelled by neuro team.

## 2022-05-10 NOTE — ED Notes
1:25 PM Continuing care with RN Morrie Sheldon. Neuro at bedside. Pt pending MRI but wants to be sedated, endorses claustrophobia. Ativan ordered to be given prior to MRI, per neuro planning for valium at time of MRI as well. Pt requesting crackers, okay to eat per MD Rosanne Gutting, provided to pt. Pt states crackers made her feel better. Given tylenol per MAR. Pt resting on stretcher in NAD with breathing even and unlabored.4:12 PMPt continues resting in NAD with breathing even and unlabored awaiting MRI.4:19 PMPt moving to C8H---Floor Handoff Telemetry: 	[]  Yes		[x]  NoCode Status:   [x]  Full		[]  DNR		[]  DNI		Other (specify):Safety Precautions: []  Fall Risk  []  Sitter   []  Restraints	[]  Suicidal	[x]  None	Other (specify):Mentation/Orientation:	 A&O (Self, person, place, time) x    4      	 Disoriented to:                    	 Special Accommodations: []  Hearing impaired   []  Blind  []  Nonverbal  []  Cognitive impairmentOxygenation Upon Admission: [x]  RA	[]  NC	[]  Venti  []  Simple Mask []  Other	Baseline O2 Status? [x]  Yes	[]  NoAmbulation: [x]  Independent	[]  Cane   []  Walker	[]  Wheelchair	[]  Bedbound		[]  Hemiplegic	[]  Paraplegic	[]  QuadraplegicEliminiation: [x]  Independent	[]  Commode	[]  Bedpan/Urinal  []  Straight Cath []  Foley cath			[]  Urostomy	[]  Colostomy	Other (specify):Diarrhea/Loose stool : []  1x within 24h  []  2x within 24h  []  3x within 24h  []  None 	C.Diff Order: 	[]  Ordered- needs to be collected             []  Collected-sent to lab             []  Resulted - Negative C.Diff             []  Resulted - Positive C.Diff[x]  Not Ordered   [x]  N/ASkin Alteration: []  Pressure Injury []  Wound [x]  None []  Skin not assessedDiet: [x]  Regular/No order placed	[]  NPO		Other (specify):IV Access: [x]  PIV   []  PICC    []  Port    [] Central line    []  A-line    Other (specify)IVF/GTT Running Upon ED Departure? [x]  No	    []  Yes (specify):Outstanding Meds/Treatments/Tests:Patient Belongings: with ptAre the belongings documented?          [x]  No	    []  YesIs someone taking belongings home?   [x]  No     []  Yes  Who? (specify)                                   Wilford Sports, RN

## 2022-05-11 DIAGNOSIS — Z888 Allergy status to other drugs, medicaments and biological substances status: Secondary | ICD-10-CM

## 2022-05-11 DIAGNOSIS — E7800 Pure hypercholesterolemia, unspecified: Secondary | ICD-10-CM

## 2022-05-11 DIAGNOSIS — R531 Weakness: Secondary | ICD-10-CM

## 2022-05-11 DIAGNOSIS — I252 Old myocardial infarction: Secondary | ICD-10-CM

## 2022-05-11 DIAGNOSIS — Z880 Allergy status to penicillin: Secondary | ICD-10-CM

## 2022-05-11 DIAGNOSIS — M069 Rheumatoid arthritis, unspecified: Secondary | ICD-10-CM

## 2022-05-11 DIAGNOSIS — R200 Anesthesia of skin: Secondary | ICD-10-CM

## 2022-05-11 DIAGNOSIS — J45909 Unspecified asthma, uncomplicated: Secondary | ICD-10-CM

## 2022-05-11 DIAGNOSIS — I10 Essential (primary) hypertension: Secondary | ICD-10-CM

## 2022-05-11 DIAGNOSIS — Z881 Allergy status to other antibiotic agents status: Secondary | ICD-10-CM

## 2022-05-11 DIAGNOSIS — Z87891 Personal history of nicotine dependence: Secondary | ICD-10-CM

## 2022-05-11 DIAGNOSIS — R42 Dizziness and giddiness: Secondary | ICD-10-CM

## 2022-05-11 NOTE — Other
[facility]	 Patient Name: Zoe Dawn DolphinAge: 52 y.o.Sex: femaleMRN: [id number] Stroke Service NoteStroke Details JACQELINE Riddle is a 52 y.o. with PMHx significant for HTN, HLD, asthma, STEMI, presenting as a stroke code for dizziness, LUE stiffness and left hand paresthesias/numbnessPatient reports onset of dizziness, described as light-headedness and not room spinning by patient worse upon standing, at 10 PM on 05/09/22. Persistent on arrival to ED, provoked by head movement and standing, not present at rest.Patient endorses transient LUE stiffness and numbness (Described as sensation of stiffness and not definitive weakness per patient) and tingling and possible numbness in fingers. Patient reports that she has similar symptoms in her LUE whenever her BP is elevatedBP: 201-225/105-106 on arrival. Patient reports that she weaned herself off of medications, because she wants to manage with natural regimens. Patient History and Evaluation:Last seen well: 05/10/2022 10:00 PMSymptoms first discovered: 05/10/2022 10:00 PMSymptoms discovered by: PatientYNHHS ED Arrival Time/Date: 10:09 AM/5/7/24Stroke Code Activated: 05/10/2022 10:14 AMStroke Team Arrived: 05/10/2022 10:15 AMClinical Evaluation:NIHSS1a. Level of consciousness 0 - Alert 1b. Level of consciousness questions 0 - Answers both correctly 1c. Level of consciousness command 0 - Executes both correctly 2.   Best gaze 0 - Normal 3.   Visual 0 - No visual loss 4.   Facial palsy 0 - Normal 5a. Motor left arm 0 - No drift 5b. Motor right arm 0 - No drift 6a. Motor left leg 0 - No drift 6b. Motor right leg 0 - No drift 7.   Limb ataxia 0 - Absent 8.   Sensory 0 - Normal 9.   Best language 0 - No aphasia 10. Dysarthria  0 - Normal articulation 11. Extinction & Inattention 0 - No neglect NIHSS Total Score 0 Medical History: PMH PSH Past Medical History: Diagnosis Date  Asthma   Last attack was when she was 70. She takes albuterol prn.  Hypercholesteremia   Hypertension   Radiculopathy   Rheumatoid arthritis (HC Code)   Rheumatoid arthritis (HC Code)   Rheumatoid arthritis involving ankle, unspecified laterality, unspecified rheumatoid factor presence   Rheumatoid arthritis involving ankle, unspecified laterality, unspecified rheumatoid factor presence   STEMI involving left anterior descending coronary artery (HC CODE) (HC Code)   Thrombophlebitis   Uterine fibroid   Past Surgical History: Procedure Laterality Date  WISDOM TOOTH EXTRACTION    Family History family history includes Heart disease in her maternal grandmother; Hypertension in her mother; Lupus in her father; Thyroid disease in her father.Social History  reports that she quit smoking about 5 years ago. Her smoking use included cigarettes. She started smoking about 32 years ago. She has a 27.0 pack-year smoking history. She has never used smokeless tobacco. She reports current alcohol use of about 21.0 standard drinks of alcohol per week. She reports that she does not use drugs.Prior to Admission Medications (Not in a hospital admission) Allergies Allergies Allergen Reactions  Feraheme [Ferumoxytol] Shortness Of Breath  Penicillins Anaphylaxis  Doxycycline   Medications Scheduled Meds:No current facility-administered medications for this encounter. Continuous Infusions:PRN Meds:. Objective: Vitals:Last 24 hours: Temp:  [97.9 ?F (36.6 ?C)] 97.9 ?F (36.6 ?C)Pulse:  [64] 64Resp:  [18] 18BP: (201-225)/(105-106) 225/105SpO2:  [97 %-98 %] 97 %Labs: No results for input(s): WBC, HGB, HCT, PLT in the last 168 hours. No results for input(s): NEUTROPHILS, LABLYMP, LABEOS, BANDSP in the last 168 hours. No results for input(s): NA, K, CL, CO2, BUN, CREATININE, GLU, ANIONGAP in the last 168 hours. No results for input(s): CALCIUM, MG, PHOS in the  last 168 hours. No results for input(s): ALT, AST, ALKPHOS, BILITOT, BILIDIR in the last 168 hours. No results for input(s): PTT, LABPROT, INR in the last 168 hours. Micro: No results for input(s): LABBLOO, LABURIN, LOWERRESPIRA in the last 168 hours.Physical Exam General: Well-appearing, lying comfortably in bed, NADHEENT: anicteric scleraCardiac: regular ratePulm: Normal work of breathing on RAExtremities: No BLE edemaNeuro:Mental status: Alert and oriented to person, place, month/year, situation. Able to name high and low frequency objects.Cranial nerves: PERRL. EOMI. R beating horizontal nystagmus on right gaze only. Corrective sacades on head turn to left. No vertical skew. VFF without extinction. Face symmetricMotor: --RUE: 5/5 without drift--LUE:5/5 without drift--RLE: 5/5 without drift--LLE: 5/5 without driftCoordination: FNF intact bilaterally without dysmetriaSensory: Intact to LT throughoutReflexes: toes down-going bilaterallyImagingCXR (portable)Result Date: 5/7/2024XR CHEST PA OR AP INDICATION: Sepsis evaluation COMPARISON: XR CHEST PA AND LATERAL 2017-11-26 FINDINGS:   Within limitations of portable imaging and low lung volumes, no definite pulmonary edema, pleural effusion, focal consolidation or pneumothorax is seen. The cardiomediastinal silhouette is within normal limits for size. No acute displaced rib fractures are identified.  No radiographic evidence of acute cardiopulmonary process. [place] Radiology Notify System Classification: Routine. Report initiated by:  Jeanette Caprice, RRA Reported and signed by: Antonieta Loveless, MD  [facility] Radiology and Biomedical Imaging [place]  Neuro Perfusion [facility])Result Date: 5/7/2024CT NEURO PERFUSION [facility]) INDICATION: 52 years of age Female, stroke? 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 Following data acquisition independent software perfusion calculations as well as the production of color maps of mean transit time (MTT), Tmax, cerebral blood volume (CBV), and cerebral blood flow (CBF) were produced using the RAPID software platform and sent to PACS. IV contrast administered:  40 mL iohexoL (OMNIPAQUE) 350 mg iodine/mL injection CTP FINDINGS The study is optimal. COLOR MAPS OF CBV, CBF, MTT AS WELL AS SUMMARY IMAGES OF INPUT AND OUTPUT FUNCTIONS AND RAW DATA ARE SENT TO PACS ARTERIAL INPUT FUNCTION (AIF) AND VENOUS OUTFLOW FUNCTION (VOF) PLOT IS SATISFACTORY . CEREBRAL BLOOD FLOW (CBF<30%) is 0 ml VOLUME TMAX > 6.0s is 0 ml FINAL MISMATCH SUMMARY MISMATCH VOLUME 0 ml MISMATCH RATIO none There is no prolonged Tmax  to suggest tissue at risk. There are no areas of cerebral blood flow below the 30% threshold to suggest infarct core. No infarct core  based on RAPID data. Tissue at risk of 0  mL [facility] Radiology Notify System Classification: Routine. Report initiated by:  Rich Fuchs, DO Reported and signed by: Manuela Neptune, MD  [facility] Radiology and Biomedical Imaging CTA Head Neck Stroke Code W IV ContrastResult Date: 5/7/2024CTA HEAD NECK STROKE CODE W IV CONTRAST performed on 05/10/2022 10:32 AM INDICATION: Neuro deficit, acute, stroke suspected. Please note, the patient's symptomatology including laterality of symptoms has not been provided. COMPARISON: [place] head 05/10/2022 TECHNIQUE:   angiography of the head and neck was performed with 3D/MIP coronal, and sagittal reconstructions for evaluation of the cervical vasculature as well as the circle of Willis.  Carotid vascular measurements of stenosis were made according to the NASCET criteria. IV contrast was administered.  CTA imaging was analyzed using AI LVO technology to assist in rapid detection of large vessel occlusions and provide decision support to  the radiologist. 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 Right common carotid, cervical internal carotid arteries: The right common carotid artery origin is patent. Retropharyngeal course of the proximal right internal carotid artery. The right common carotid artery, bifurcation and cervical internal carotid artery are patent. Right vertebral artery: The origin and cervical segments of the right vertebral artery are patent. No evidence of hemodynamically significant stenosis, dissection, or pseudoaneurysm. Left common carotid, cervical internal carotid arteries: The left common carotid artery origin is patent. Retropharyngeal course of the mid cervical segment of the internal carotid artery. The left common carotid artery, bifurcation and cervical internal carotid artery are patent. Left vertebral artery: The origin of the left vertebral artery is likely severely stenotic to nearly occluded best appreciated on sagittal reformats (series 3 image 298). Otherwise, the cervical vertebral artery is patent. No evidence of dissection. CTA Head: Anterior Circulation: No significant calcifications of the carotid siphons is seen. The petrous, cavernous and supraclinoid segments of the intracranial carotid arteries and major branches of the middle and anterior cerebral arteries are patent. There is no aneurysm, arterial occlusion, or vascular malformation. Posterior Circulation: No significant calcification of the vertebral arteries is seen. The vertebrobasilar system, posterior inferior cerebellar arteries, anterior inferior cerebellar arteries, superior cerebellar arteries and posterior cerebral arteries are patent. There is no aneurysm, arterial occlusion, or vascular malformation. Soft tissues: Diffuse enlargement of the thyroid. There is periapical lucency and dental caries involving the remaining right mandibular premolar (series 3 image 212) with dehiscence along the anterior margins. Lungs: No pulmonary abnormality seen. Bones: No acute osseous finding. 1. 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 Otherwise, no evidence of significant arterial stenosis or vessel occlusion within the neck. 2. CTA head demonstrates no vessel cut off, significant stenosis or aneurysm. 3. There is diffuse poor dentition, with large apical lucency involving the remaining right mandibular premolar with suggestion of osseous dehiscence. Recommend correlation for possible odontogenic abscess. [facility] Radiology Notify System Classification: Routine. Report initiated by:  Rich Fuchs, DO Reported and signed by: Manuela Neptune, MD  [facility] Radiology and Biomedical Imaging [place] Head Stroke Code WO IV ContrastResult Date: 5/7/2024CT HEAD WO IV CONTRAST (STROKE) INDICATION: Neuro deficit, acute, stroke suspected. Dizziness, left upper extremity weakness and numbness. COMPARISON: [place] HEAD CERVICAL SPINE WO IV CONTRAST ([facility]) 2020-09-06 TECHNIQUE: [place] images were obtained from the skull base to the vertex without intravenous contrast. Coronal and sagittal multiplanar reformatted images were provided. FINDINGS: There is no intracranial hemorrhage, edema, mass, mass effect or midline shift. There is no evidence of acute major vascular distribution infarct. The ventricles and sulci are symmetric and normal in size. The basal cisterns are patent. The paranasal sinuses and mastoid air cells are clear. The visualized orbits and osseous structures are unremarkable.   No acute intracranial abnormality. Please consult separately performed and dictated CTA head and neck for full evaluation of potential intracranial pathology. [place] Radiology Notify System Classification: Routine. Report initiated by:  Cay Schillings, MD Reported and signed by: Antonieta Loveless, MD  [facility] Radiology and Biomedical Imaging   Assessment and Plan SENIYAH ESKER is a 52 y.o.  with PMHx significant for HTN, HLD, asthma, STEMI, presenting as a stroke code for dizziness, LUE tightness, and numbness. Exam notable for right beating horizontal nystagmus on right gaze  only with corrective saccades on head turn to left without vertical skew, patient is otherwise neurologically intact.Head [place] with no acute findings. 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 Transient LUE tightness and paresthesias in setting of BP: 220s/100s noted on arrival most consistent with hypertensive emergency (patient endorses similar symptoms in past with elevated BP and denies definitive weakness) though should obtain MRI brain w/o contrast to assess for stroke. Dizziness worse upon standing with unilateral horizontal nystagmus on right gaze only and corrective saccades with head turn to the left is most in line with peripheral vertigo (though patient describes more as light-headedness), should clarify with MRI brain w/o contrast. If patient is unable to tolerate due to claustrophobia, should obtain head [place] after 10 PM (symptom onset 05/09/22 at 10 PM) to assess for infarctPlan:#LUE heaviness and numbness- MRI brain w/o contrast, if no infarct, no further neurologic work-up indicated- PRN meclizine for dizziness- if unable to tolerate MRI brain, obtain head [place] after 10 PM on 05/10/22 if patient is unable to tolerate MRI- SBP goal <220- continue statin, discuss starting aspirin 81 mg with patient if stroke noted on MRI.- If stroke on MRI or head [place], admit to stroke service with TTE with bubble, telemetry, stroke labs- outpatient dentistry for large apical lucency in R mandibular premolar tooth noted on -A H/N- outpatient neurology follow-up if no stroke on MRIDiscussed with stroke fellow Dr. Bufford Buttner, attending addendum to followSIGNED:Basil Marcille Buffy, MD PGY-3, Neurology Resident5/7/24===============================ImpressionNo evidence of acute infarct or hemorrhage is seen.[facility] Radiology Notify System Classification: Routine. ConclusionMRI BRAIN WO IV CONTRAST performed on 05/10/2022 7:30 PM INDICATION: Neuro deficit, acute, stroke suspectedTHIN CUTS THROUGH BRAINSTEM. COMPARISON: Prior CTA from same day TECHNIQUE: Multiplanar MR of the brain was performed without administration of contrast. FINDINGS: There is no acute infarct. There is no intracranial hemorrhage. There is no mass effect, edema, or midline shift. Multiple foci of FLAIR signal abnormality are noted in the subcortical and pontine white matter, which may represent small vessel ischemic change. Small area of T2 hyperintensity is noted in the right hemipons, likely representing an old infarct. The ventricles are symmetric and normal in size.   No extra-axial collection is seen. The pituitary, pineal gland, craniocervical junction, and upper cervical spinal cord are normal in configuration. The visualized orbits are unremarkable.The paranasal sinuses are clear. The mastoid air cells are clear. IMPRESSION: No evidence of acute infarct or hemorrhage is seen.   [facility] Radiology Notify System Classification: Routine. Reported and signed by: Valrie Hart, MD  [facility] Radiology and Biomedical Imaging ==========================================================Vascular Neurology Attending I have interviewed and examined the patient TABIA LANDOWSKI today 05/10/2022  with the vascular neurology fellow Dr. Eldridge Abrahams and resident [place]. I agree with the findings and plan of care as formulated in the note, with additional comments as follows:Patient presented with elevated BP. Exam findings hinting towards peripheral etiology. HINT normal. No loss of hearing. CTP unremarkable for Tmax elevation in posterior circulation. MRI obtained to confirm mimics of peripheral mechanism, read normal. Clinical presentation of isloated vertigo, less likely to be of central cause. Further management per ED. Out patient neurology follow up. Kimsey Demaree K. Lanell Matar, M.D., Ph.D.Assistant Professor of NeurologyDivision of Stroke & Vascular NeurologyDepartment of Neurology, [employer].

## 2022-05-11 NOTE — Discharge Instructions
There is a work/school excuse at the end of this document in case you need it.Please follow up with the neurologists, see information for referral below. You should also follow up with your primary care doctor.For your dizziness:- take the meclizine as prescribed- look up videos of the Epley maneuver on youtube and see if they are helpfulIf you develop fever (temperature greater than 100.2F), severe nausea/vomiting, if you are unable to keep yourself hydrated with fluids, have severe abdominal pain, lightheadedness or feel like you are going to pass out, chest pain, difficulty breathing, confusion, visual changes, or other worsening or concerning symptoms, please return to the emergency department.

## 2022-05-11 NOTE — Telephone Encounter
Courtesy call patient to see how she is feeling and offer PCP options if needed.  She was contacted and is currently at the chiropractors office, she states she has not have a PCP due to having no health insurance, she was advised to call back if she would like options for [facility] or [employer] and she verbalized understanding.

## 2022-05-11 NOTE — Other
[facility] ED Observation Progress NoteAdmit to Observation Status: Obs date: 05/10/2022  3:15 PMCondition:UnchangedVital signs: Normal  Yes BP (!) 148/80  - Pulse 71  - Temp 98.1 ?F (36.7 ?C) (Oral)  - Resp 16  - SpO2 99% Vital signs reviewed per nursing notesGeneral:  Distress: no distressHeart:  normal rateLungs:  Clear to auscultation bilaterally.  Normal respiratory effortNeuro: at baselineAbdomen: Soft, nontender YesSkin: Warm and well perfused YesMdm:52 year old female patient presents with abrupt onset of dizziness described as feeling unbalanced when walking that started approximately 11:00 p.m. last evening.  Patient also reporting left arm weakness and decreased sensation.Pt initially arrived as stroke alert but cancelledNeurology is following pt Physical exam as noted with no obvious focal neurological deficitsPatient was placed in ED observation pending MRI brainDc criteria:Completion of mri imaging and final neuro recs G. Electa Sniff NP  [phone] ------------------------------------------------------------------------------MRI with no acute infarct.Per neuro note, if no acute infarct, ok for dc with meclizine.Re-assessed patient who states she is no longer dizzy and feels back to baselineSteady gait appreciated, normal neuro examAmb referral neuro placedDiscussed discharge, supportive care, follow up, and strict return precautions with the patient. Patient verbalized understanding.Dr. Josetta Huddle available for discussion.Lily Lovings, PAResponse to management: ImprovedObservation course: Discharge with PCP/Specialist Follow Up for vertigo Loletta Specter, PA05/07/24 2100 Cecille Rubin, APRN05/08/24 [phone]

## 2022-06-05 ENCOUNTER — Inpatient Hospital Stay
Admit: 2022-06-05 | Discharge: 2022-06-05 | Payer: PRIVATE HEALTH INSURANCE | Attending: Student in an Organized Health Care Education/Training Program

## 2022-06-05 LAB — BASIC METABOLIC PANEL
BKR ANION GAP: 10 (ref 7–17)
BKR BLOOD UREA NITROGEN: 13 mg/dL (ref 6–20)
BKR BUN / CREAT RATIO: 18.6 ng/L (ref 8.0–23.0)
BKR CALCIUM: 8.2 mg/dL — ABNORMAL LOW (ref 8.8–10.2)
BKR CHLORIDE: 105 mmol/L (ref 98–107)
BKR CO2: 23 mmol/L (ref 20–30)
BKR CREATININE: 0.7 mg/dL — ABNORMAL LOW (ref 0.40–1.30)
BKR EGFR, CREATININE (CKD-EPI 2021): >60 mL/min/{1.73_m2} (ref >=60)
BKR GLUCOSE: 114 mg/dL — ABNORMAL HIGH (ref 70–100)
BKR POTASSIUM: 3.4 mmol/L (ref 3.3–5.3)
BKR SODIUM: 138 mmol/L (ref 136–144)

## 2022-06-05 LAB — CBC WITH AUTO DIFFERENTIAL
BKR WAM ABSOLUTE IMMATURE GRANULOCYTES.: 0.01 x 1000/ÂµL (ref 0.00–0.30)
BKR WAM ABSOLUTE LYMPHOCYTE COUNT.: 2.23 x 1000/ÂµL (ref 0.60–3.70)
BKR WAM ABSOLUTE NRBC (2 DEC): 0 x 1000/ÂµL (ref 0.00–1.00)
BKR WAM ANALYZER ANC: 3.19 x 1000/ÂµL (ref 2.00–7.60)
BKR WAM BASOPHIL ABSOLUTE COUNT.: 0.06 x 1000/ÂµL (ref 0.00–1.00)
BKR WAM BASOPHILS: 0.9 % (ref 0.0–1.4)
BKR WAM EOSINOPHIL ABSOLUTE COUNT.: 0.31 x 1000/ÂµL (ref 0.00–1.00)
BKR WAM EOSINOPHILS: 4.8 % (ref 0.0–5.0)
BKR WAM HEMATOCRIT (2 DEC): 35.1 % (ref 35.00–45.00)
BKR WAM HEMOGLOBIN: 11.3 g/dL — ABNORMAL LOW (ref 11.7–15.5)
BKR WAM IMMATURE GRANULOCYTES: 0.2 % (ref 0.0–1.0)
BKR WAM LYMPHOCYTES: 34.6 % (ref 17.0–50.0)
BKR WAM MCH (PG): 26.3 pg — ABNORMAL LOW (ref 27.0–33.0)
BKR WAM MCHC: 32.2 g/dL — ABNORMAL LOW (ref 31.0–36.0)
BKR WAM MCV: 81.8 fL (ref 80.0–100.0)
BKR WAM MONOCYTE ABSOLUTE COUNT.: 0.65 x 1000/ÂµL (ref 0.00–1.00)
BKR WAM MONOCYTES: 10.1 % (ref 4.0–12.0)
BKR WAM MPV: 9.3 fL (ref 8.0–12.0)
BKR WAM NEUTROPHILS: 49.4 % (ref 39.0–72.0)
BKR WAM NUCLEATED RED BLOOD CELLS: 0 % (ref 0.0–1.0)
BKR WAM PLATELETS: 297 x1000/ÂµL (ref 150–420)
BKR WAM RDW-CV: 13.7 % (ref 11.0–15.0)
BKR WAM RED BLOOD CELL COUNT.: 4.29 M/ÂµL (ref 4.00–6.00)
BKR WAM WHITE BLOOD CELL COUNT: 6.5 x1000/ÂµL (ref 4.0–11.0)

## 2022-06-05 LAB — IRON AND TIBC
BKR IRON SATURATION: 19 % — ABNORMAL HIGH (ref 15–50)
BKR IRON: 45 ug/dL (ref 37–145)
BKR TOTAL IRON BINDING CAPACITY: 240 ug/dL — ABNORMAL LOW (ref 250–450)

## 2022-06-05 LAB — TROPONIN T HIGH SENSITIVITY, 1 HOUR WITH REFLEX (BH GH LMW YH)
BKR TROPONIN T HS 1 HOUR DELTA FROM 0 HOUR: -1 ng/L
BKR TROPONIN T HS 1 HOUR: <6 ng/L

## 2022-06-05 LAB — FERRITIN: BKR FERRITIN: 56 ng/mL — ABNORMAL LOW (ref 13–150)

## 2022-06-05 LAB — TROPONIN T HIGH SENSITIVITY, 0 HOUR BASELINE WITH REFLEX (BH GH LMW YH): BKR TROPONIN T HS 0 HOUR BASELINE: 7 ng/L

## 2022-06-05 MED ORDER — CYCLOBENZAPRINE 10 MG TABLET
10 mg | Freq: Once | ORAL | Status: CP
Start: 2022-06-05 — End: ?
  Administered 2022-06-05: 06:00:00 10 mg via ORAL

## 2022-06-05 NOTE — ED Provider Notes
Chief Complaint Patient presents with  Hypertension   Pt BIBA from home with reports of +HTN. Pt reporting that BUEs tingling woke her from sleep. Initial BP reading for EMS was 210/109, 12 lead EKG unremarkable. Pt states she is not on BP medications but diet control. Endorses intermittent spams to all extremities.  MDM  Physical ExamED Triage Vitals [06/05/22 0115]BP: (!) 156/92Pulse: 79Pulse from  O2 sat: n/aResp: 18Temp: 98 ?F (36.7 ?C)Temp src: OralSpO2: 99 % BP (!) 142/99  - Pulse 71  - Temp 98 ?F (36.7 ?C) (Oral)  - Resp 18  - SpO2 99% Physical ExamVitals and nursing note reviewed. Constitutional:     Appearance: Normal appearance. HENT:    Head: Normocephalic.    Nose: Nose normal.    Mouth/Throat:    Mouth: Mucous membranes are moist. Eyes:    Conjunctiva/sclera: Conjunctivae normal. Cardiovascular:    Rate and Rhythm: Normal rate and regular rhythm. Pulmonary:    Effort: Pulmonary effort is normal.    Breath sounds: Normal breath sounds. Abdominal:    General: Abdomen is flat. Musculoskeletal:    Comments: No pedal edema, no calf tenderness with palpation bilaterally Skin:   General: Skin is warm and dry.    Capillary Refill: Capillary refill takes less than 2 seconds. Neurological:    Mental Status: She is alert. Psychiatric:       Mood and Affect: Mood normal.  ProceduresAttestation/Critical CareComments as of 06/05/22 0459 Sun Jun 05, 2022 0134 An acute or life threatening problem was considered during this evaluation  A decision regarding hospitalization was made during this visitCritical care provided by attending: see above, if not listed, no critical care was providedPatient progress: stableStroke DocumentationStroke/TIA: No HPI:52 yo f PMH hypertension rheumatoid arthritis hyperlipidemia STEMI not on AC or AP presents with body spasms that woke her up.  Patient endorsed that she was it gets body spasms, but worsened today.  Patient also endorsed having neck tightness.  Patient endorsed that she was hyperventilating and had pins and needles in her bilateral upper extremities, but that resolved.  Denies any chest pain, dyspnea, fever, chills, abdominal pain, urinary, or bowel symptoms.Physical exam: Please see exam sectionMDM:Based off HPI, physical exam, my differential includes:  Iron-deficiency versus anemia versus ACS, will obtain EKG, troponin. ED course:Troponin delta flat, patient felt better with Flexeril, EKG within normal limits.  Not iron-deficiency.ED observation and hospitalization were considered, but inappropriate at this time. Patient will be discharged with outpatient follow-up, all questions answered, discharge instructions printed and handed to patient. Patient will return if worsening symptoms or feeling unwell. Patient comfortable and verbalizes agreement with discharge. [JL]  Comments User Index[JL] Lajean Manes, MD   Clinical Impressions as of 06/05/22 0459 Muscle spasm  ED DispositionDischarge Lajean Manes, MD06/02/24 [phone]

## 2022-06-05 NOTE — ED Notes
1:19 AM Pt arrives to ED BIBA from home with tingling and spasming in both arms. Hx HTN, does not take meds. EMS reports on scene pressure 210/109, in ED triage pressure 159/92. Patient A+Ox4, airway intact and speaking in full and complete sentences, denies chest pain, denies nausea/vomiting, ambulates independently with balance intact and steady gait. 2:45 AM Pt ambulatory to bathroom with steady gait and balance intact. Systolic <170 while in ED.4:00 AM Patient medically cleared for discharge by provider. Instructions provided at bedside and patient verbalized understanding. Pt able to ambulate independently with steady gait and balance intact. Discharged with all belongings.

## 2022-06-05 NOTE — Discharge Instructions
Please return to emergency room if you have fever, chills, nausea, vomiting, chest pain, shortness of breath, abdominal pain, feeling unwell, or worsening symptoms.  Please follow-up with your primary care doctor.

## 2022-06-05 NOTE — ED Notes
SOCIAL WORK NOTEPatient Name: Zoe Martinell DolphinMedical Record Number: [id number] Date of Birth: September 13, 1972Medical Social Work Follow Up  [employer] Most Recent Value Admission Information  Document Type Progress Note (For Inpatient/ED Only) Prior psychosocial assessment has been documented within this hospitalization No (For Inpatient/ED Only) Prior psychosocial assessment has been documented within 30 days of this hospitalization No Reason for Current Social Work Scientist, water quality Medical Team Comment RN Record Reviewed No Level of Care Emergency Department Psychosocial issues requiring intervention Transportation Psychosocial interventions 10 minutes spent on this case. SW consulted by RN to arrange for transportation home on hospital account d/t insufficient funds/insurance coverage. Ride reserved to address on file via M7. Academic librarian Specific referrals to enhance community supports (include existing and new resources) [facility] Handoff Required? No Next Steps/Plan (including hand-off): SW intervention complete Signature: Ladean Raya, LMSW Contact Information: [facility]

## 2022-06-07 NOTE — Telephone Encounter
Message from ED provider requesting Zoe Riddle be contacted and assisted with establishing primary care.  Spoke with her and she does not have insurance or a PCP. Zoe Riddle was given the number for the [facility] and advised to call to establish care and f/u for HTN.  She was appreciative of the information and call.

## 2022-07-14 ENCOUNTER — Encounter: Admit: 2022-07-14 | Payer: PRIVATE HEALTH INSURANCE

## 2022-07-15 ENCOUNTER — Encounter: Admit: 2022-07-15 | Payer: PRIVATE HEALTH INSURANCE

## 2023-04-17 ENCOUNTER — Inpatient Hospital Stay: Admit: 2023-04-17 | Discharge: 2023-04-17 | Payer: PRIVATE HEALTH INSURANCE | Attending: Emergency Medicine

## 2023-04-17 LAB — BASIC METABOLIC PANEL
BKR ANION GAP: 11 (ref 7–17)
BKR BLOOD UREA NITROGEN: 12 mg/dL (ref 6–20)
BKR BUN / CREAT RATIO: 15 (ref 8.0–23.0)
BKR CALCIUM: 9.3 mg/dL (ref 8.8–10.2)
BKR CHLORIDE: 101 mmol/L (ref 98–107)
BKR CO2: 25 mmol/L (ref 20–30)
BKR CREATININE DELTA: 0.1
BKR CREATININE: 0.8 mg/dL (ref 0.40–1.30)
BKR EGFR, CREATININE (CKD-EPI 2021): >60 mL/min/{1.73_m2} (ref >=60)
BKR GLUCOSE: 101 mg/dL — ABNORMAL HIGH (ref 70–100)
BKR POTASSIUM: 3.7 mmol/L (ref 3.3–5.3)
BKR SODIUM: 137 mmol/L (ref 136–144)

## 2023-04-17 MED ORDER — AMLODIPINE 10 MG TABLET
10 | ORAL_TABLET | Freq: Every day | ORAL | 1 refills | Status: AC
Start: 2023-04-17 — End: ?

## 2023-04-17 MED ORDER — LISINOPRIL 40 MG TABLET
40 | Freq: Once | ORAL | Status: CP
Start: 2023-04-17 — End: ?
  Administered 2023-04-17: 18:00:00 40 mg via ORAL

## 2023-04-17 MED ORDER — CLINDAMYCIN HCL 75 MG CAPSULE
75 | Freq: Once | ORAL | Status: CP
Start: 2023-04-17 — End: ?
  Administered 2023-04-17: 20:00:00 75 mg via ORAL

## 2023-04-17 MED ORDER — KETOROLAC 30 MG/ML (1 ML) INJECTION SOLUTION
30 | Freq: Once | INTRAMUSCULAR | Status: CP
Start: 2023-04-17 — End: ?
  Administered 2023-04-17: 18:00:00 30 mL via INTRAMUSCULAR

## 2023-04-17 MED ORDER — CLINDAMYCIN HCL 300 MG CAPSULE
300 | ORAL_CAPSULE | Freq: Four times a day (QID) | ORAL | 1 refills | Status: AC
Start: 2023-04-17 — End: ?

## 2023-04-17 MED ORDER — NAPROXEN 500 MG TABLET
500 | ORAL_TABLET | Freq: Two times a day (BID) | ORAL | 1 refills | Status: AC | PRN
Start: 2023-04-17 — End: ?

## 2023-04-17 MED ORDER — AMLODIPINE 10 MG TABLET
10 | Freq: Once | ORAL | Status: CP
Start: 2023-04-17 — End: ?
  Administered 2023-04-17: 18:00:00 10 mg via ORAL

## 2023-04-17 NOTE — ED Notes
 6:15 PM Pt presents to the ED CC:  Dental pain.  Hypertension noted at triage.  Pt ambulated to [facility], well appearing.  Warm blanket and pillow provided.  Pt states earlier today she started having a toothache on the lower left where a filling fell out.  Pt medicated for pain and HTN at triage.  Pt in NAD.  6:30 PM  BP 181/1027:14 PM Verbal report given to on-coming RN. PT care transferred at this time.

## 2023-04-17 NOTE — Discharge Instructions
 You were seen today in the [facility] emergency department for dental pain. Please take the prescribed clindamycin infection for the next 10 days. Please take the prescribed naproxen for any continued pain. Please take amlodipine daily for high blood pressure. Please call the dental clinic we are referring you to for follow up of your pain, and the primary care clinic we are referring you to for follow up of your blood pressure.Please return to the ER if you develop fever not controlled by Tylenol or ibuprofen, confusion or strange behavior, new or increased pain, difficulty breathing, uncontrollable vomiting, or any other new or concerning symptoms. We are always open.Please follow up with your doctor as soon as possible about your symptoms.A copy of your tests is provided with your discharge papers. If you have any problems with your prescriptions, or any problems after discharge, there is a nurse available Monday through Friday from 7:00am-3:30pm at [phone] to help answer any questions. You can also call the Emergency Department directly at [phone].

## 2023-04-17 NOTE — ED Notes
 7:16 PM Verbal report received from off-going RN. This RN assumed care of PT at this time. Left side Dental pain started today, also pt is hypertensive, does not  take medication, takes home remedies. AAOx3, nad

## 2023-04-17 NOTE — ED Triage Note
 Provider in Triage Note38 y.o. year old female  presents with toothache left lower jaw of several weeks duration.  Patient noncompliant with antihypertensive medications for over a year.Physical Exam: tooth #19 large cavityOrders placed in triage: yesDisposition: Main Emergency Department for further evaluation.Jarae Panas Bayer4/14/2025 5:18 PM

## 2023-04-18 NOTE — Telephone Encounter
 Courtesy call to patient after ED discharge to inquire about how she is feeling and offer PCP options if needed.  She was contacted and states she will pick up her amlodipine prescription today as well as her antibiotic.  She is uninsured currently, and in the past has been told that her visits would cost 300 dollars with primary care which she was unable to afford.  She has received a call this morning from [facility] and will fill out paperwork to discuss sliding scale fee with them.  She was advised of the dangers of uncontrolled hypertension and the importance of managing her blood pressure to avoid long-term complications.  She is unable to schedule any appointments for follow up at this time but advised that her prescription is for a 60 day supply only and will need blood pressure monitoring and refills of medication if her blood pressure does not normalize with treatment.  She verbalized understanding of the information given and was encouraged to call back once she determine the cost of her care to discuss follow-up options.  He verbalized understanding.

## 2023-04-19 NOTE — ED Provider Notes
 Chief Complaint Patient presents with  Dental Pain   Pt reporting dental pain on upper left jaw. Endorsing 10/10 pain. Pt reports the filling fell out a long time ago but severe pain x 1 day/.   Hypertension   Pt BP 212/125. Pt states she stopped her BP meds over 1 year ago and turned to natural remedies MDM Medical Decision Making:Patient is a 53 y.o. female w/ PMH of HTN, HLD, asthma, CAD who presents with dental pain and left mandibular molar. Reports she has had pain in this area for a long time but only became severe today. Is still tolerating PO normally. Denies fever, chills, voice change, drooling, sore throat. She denies having a dentist who she sees regularly. Also reports that she has not taken antihypertensives in many years, and prefers to avoid medications if at all possible. Denies chest pain, shortness of breath, or blurry vision, and only had mild headache today when dental pain was at its maximum.Full physical exam as documented below.Differential Diagnosis and Evaluation: Patient's pain appears most likely due to large caries seen on exam. No clear evidence of infection on exam requiring intervention, such as abscess, but will provide antibiotics in case early/developing infection around caries is contributing to pain. Given patient's hypertension on arrival, will screen for AKI or STEMI, but no symptoms to suggest hypertensive emergency or ICH.Plan:AmlodipineLisinoprilToradolClindamycinECGBMPCourse:ECG with no STEMI. BMP with no AKI. Dc'd home with clindamycin  rx, as well as naproxen  and amlodipine  rx, and referrals to dental clinic and primary care clinic. Patient is agreeable with this plan.Evaluated with Dr. Casandra Claw Marylou Sobers) Bridget Campion, MDPGY-4 Emergency MedicineAvailable through [facility]4/16/2025 7:16 PMThis note may have been generated using M*Modal voice dictation software please excuse any typographical errors due to flaws in voice recognition.This encounter occurred within the context of the Covid-19 pandemic. -------------------------------------------------------------------------------------------------------------------------------- Physical ExamED Triage Vitals [04/17/23 1718]BP: (!) 212/125Pulse: 84Pulse from  O2 sat: n/aResp: 18Temp: 97.8 ?F (36.6 ?C)Temp src: OralSpO2: 98 % BP (!) 174/104  - Pulse 82  - Temp 97.8 ?F (36.6 ?C) (Oral)  - Resp 16  - Wt 99.3 kg  - SpO2 98%  - BMI 33.80 kg/m? Physical ExamVitals and nursing note reviewed. Constitutional:     General: She is not in acute distress.   Appearance: She is not toxic-appearing. HENT:    Head: Normocephalic and atraumatic.    Nose: Nose normal.    Mouth/Throat:    Comments: Prominent caries around left mandibular first molar (tooth #19), no signs of associated abscess. No trismusEyes:    Conjunctiva/sclera: Conjunctivae normal. Cardiovascular:    Rate and Rhythm: Normal rate and regular rhythm.    Heart sounds: Normal heart sounds. Pulmonary:    Effort: Pulmonary effort is normal.    Breath sounds: Normal breath sounds. Abdominal:    General: There is no distension.    Palpations: Abdomen is soft.    Tenderness: There is no abdominal tenderness. There is no guarding or rebound. Musculoskeletal:       General: Normal range of motion. Lymphadenopathy:    Cervical: No cervical adenopathy. Skin:   General: Skin is warm and dry. Neurological:    General: No focal deficit present.    Mental Status: She is alert and oriented to person, place, and time.  ProceduresAttestation/Critical CarePatient Reevaluation: I discussed case and plan with resident and reviewed their documentationGen: alert, oriented, non-toxic appearing Tooth number #19 is fractured.  No surrounding gingival erythema/abscess appreciatedLungs: CTABHeart-+s1s2-s3s4, no abnormal sounds appreciatedAbdomen-soft, without tendernessLower extremities-no edemaNeuro-alert, oriented, speech is clear,  no acute focal motor deficitsAdam Kortney Potvin, MDPatient progress: stableClinical Impressions as of 04/19/23 2227 Caries Pain, dental Hypertension, unspecified type  ED DispositionDischarge Attending Supervised: ResidentI saw and examined the patient. I agree with the findings and plan of care as documented in the resident's note except as noted below. Additional acute and/or chronic problems addressed: Leander Prior MD  Leander Prior, MD04/14/25 1956 Mattie Sora, MDResident04/14/25 2311 Mattie Sora, MDResident04/14/25 2312 Leander Prior, MD04/16/25 2227

## 2023-09-06 ENCOUNTER — Encounter: Admit: 2023-09-06 | Payer: PRIVATE HEALTH INSURANCE

## 2023-09-06 DIAGNOSIS — I2102 ST elevation (STEMI) myocardial infarction involving left anterior descending coronary artery: Secondary | ICD-10-CM

## 2023-09-06 DIAGNOSIS — M5410 Radiculopathy, site unspecified: Secondary | ICD-10-CM

## 2023-09-06 DIAGNOSIS — I809 Phlebitis and thrombophlebitis of unspecified site: Secondary | ICD-10-CM

## 2023-09-06 DIAGNOSIS — I10 Essential (primary) hypertension: Principal | ICD-10-CM

## 2023-09-06 DIAGNOSIS — D259 Leiomyoma of uterus, unspecified: Secondary | ICD-10-CM

## 2023-09-06 DIAGNOSIS — J45909 Unspecified asthma, uncomplicated: Secondary | ICD-10-CM

## 2023-09-06 DIAGNOSIS — M069 Rheumatoid arthritis, unspecified: Secondary | ICD-10-CM

## 2023-09-06 DIAGNOSIS — E7800 Pure hypercholesterolemia, unspecified: Secondary | ICD-10-CM

## 2024-01-11 ENCOUNTER — Emergency Department: Admit: 2024-01-11 | Payer: PRIVATE HEALTH INSURANCE

## 2024-01-11 ENCOUNTER — Inpatient Hospital Stay: Admit: 2024-01-11 | Discharge: 2024-01-11 | Payer: PRIVATE HEALTH INSURANCE | Attending: Emergency Medicine

## 2024-01-11 DIAGNOSIS — Z87892 Personal history of anaphylaxis: Secondary | ICD-10-CM

## 2024-01-11 DIAGNOSIS — Z881 Allergy status to other antibiotic agents status: Secondary | ICD-10-CM

## 2024-01-11 DIAGNOSIS — R0789 Other chest pain: Secondary | ICD-10-CM

## 2024-01-11 DIAGNOSIS — Z880 Allergy status to penicillin: Secondary | ICD-10-CM

## 2024-01-11 DIAGNOSIS — Z888 Allergy status to other drugs, medicaments and biological substances status: Secondary | ICD-10-CM

## 2024-01-11 DIAGNOSIS — Z8679 Personal history of other diseases of the circulatory system: Secondary | ICD-10-CM

## 2024-01-11 DIAGNOSIS — I252 Old myocardial infarction: Secondary | ICD-10-CM

## 2024-01-11 LAB — BASIC METABOLIC PANEL
BKR ANION GAP: 10 (ref 7–17)
BKR BLOOD UREA NITROGEN: 15 mg/dL (ref 6–20)
BKR BUN / CREAT RATIO: 16.7 (ref 8.0–23.0)
BKR CALCIUM: 8.9 mg/dL (ref 8.8–10.2)
BKR CHLORIDE: 103 mmol/L (ref 98–107)
BKR CO2: 26 mmol/L (ref 20–30)
BKR CREATININE DELTA: 0.1
BKR CREATININE: 0.9 mg/dL (ref 0.40–1.30)
BKR EGFR, CREATININE (CKD-EPI 2021): >60 mL/min/1.73m2 (ref >=60)
BKR GLUCOSE: 90 mg/dL (ref 70–100)
BKR POTASSIUM: 3.7 mmol/L (ref 3.3–5.3)
BKR SODIUM: 139 mmol/L (ref 136–144)

## 2024-01-11 LAB — CBC WITH AUTO DIFFERENTIAL
BKR WAM ABSOLUTE IMMATURE GRANULOCYTES.: 0.02 x 1000/ÂµL (ref 0.00–0.30)
BKR WAM ABSOLUTE LYMPHOCYTE COUNT.: 2.69 x 1000/ÂµL (ref 0.60–3.70)
BKR WAM ABSOLUTE NRBC: 0 x 1000/ÂµL (ref 0.00–1.00)
BKR WAM ANC (ABSOLUTE NEUTROPHIL COUNT): 3.81 x 1000/ÂµL (ref 2.00–7.60)
BKR WAM BASOPHIL ABSOLUTE COUNT.: 0.06 x 1000/ÂµL (ref 0.00–1.00)
BKR WAM BASOPHILS: 0.8 % (ref 0.0–1.4)
BKR WAM EOSINOPHIL ABSOLUTE COUNT.: 0.32 x 1000/ÂµL (ref 0.00–1.00)
BKR WAM EOSINOPHILS: 4.3 % (ref 0.0–5.0)
BKR WAM HEMATOCRIT: 38.5 % (ref 35.00–45.00)
BKR WAM HEMOGLOBIN: 12.3 g/dL (ref 11.7–15.5)
BKR WAM IMMATURE GRANULOCYTES: 0.3 % (ref 0.0–1.0)
BKR WAM LYMPHOCYTES: 35.8 % (ref 17.0–50.0)
BKR WAM MCH: 26.2 pg — ABNORMAL LOW (ref 27.0–33.0)
BKR WAM MCHC: 31.9 g/dL (ref 31.0–36.0)
BKR WAM MCV: 81.9 fL (ref 80.0–100.0)
BKR WAM MONOCYTE ABSOLUTE COUNT.: 0.61 x 1000/ÂµL (ref 0.00–1.00)
BKR WAM MONOCYTES: 8.1 % (ref 4.0–12.0)
BKR WAM MPV: 9.4 fL (ref 8.0–12.0)
BKR WAM NEUTROPHILS: 50.7 % (ref 39.0–72.0)
BKR WAM NUCLEATED RED BLOOD CELLS: 0 % (ref 0.0–1.0)
BKR WAM PLATELETS: 340 x1000/ÂµL (ref 150–420)
BKR WAM RDW-CV: 13.7 % (ref 11.0–15.0)
BKR WAM RED BLOOD CELL COUNT.: 4.7 M/ÂµL (ref 4.00–6.00)
BKR WAM WHITE BLOOD CELL COUNT: 7.5 x1000/ÂµL (ref 4.0–11.0)

## 2024-01-11 LAB — TROPONIN T HIGH SENSITIVITY, 0 HOUR BASELINE WITH REFLEX (BH GH LMW YH): BKR TROPONIN T HS 0 HOUR BASELINE: 8 ng/L

## 2024-01-11 LAB — TROPONIN T HIGH SENSITIVITY, 1 HOUR WITH REFLEX (BH GH LMW YH)
BKR TROPONIN T HS 1 HOUR DELTA FROM 0 HOUR: -2 ng/L
BKR TROPONIN T HS 1 HOUR: 6 ng/L

## 2024-01-11 MED ORDER — LIDOCAINE 4 % TOPICAL PATCH
4 | MEDICATED_PATCH | TRANSDERMAL | 1 refills | 30.00000 days | Status: AC
Start: 2024-01-11 — End: ?

## 2024-01-11 MED ORDER — ACETAMINOPHEN 500 MG TABLET
500 | Freq: Once | ORAL | Status: CP
Start: 2024-01-11 — End: ?
  Administered 2024-01-11: 15:00:00 500 mg via ORAL

## 2024-01-11 MED ORDER — LIDOCAINE 4 % TOPICAL PATCH
4 | Freq: Once | TRANSDERMAL | Status: DC
Start: 2024-01-11 — End: 2024-01-12

## 2024-01-11 NOTE — ED Provider Notes [19]
 Chief Complaint Patient presents with  Chest Pain   Chest pain x a few hours while at work. L anterior chest, non radiating. Intermittent, sharp, and throbbing. Denies N/V/D, fever & chills, & SOB. History of Present Zoe Riddle is a 54 year old female with a history of CAD, HTN, RA, not on any medications heart attack and blood clots who presents with chest pain.She developed intermittent sharp, stabbing chest pain about three hours prior to arrival, around 11:30 AM. Pain was associated with a tight sensation in both arms, worse in the left, and persisted until she arrived but has since resolved. She has not had similar sharp pain before. Her prior heart attack felt like continuous pressure, different from today's pain.She is not on any medications, including blood thinners, which she stopped years ago. She reports recently elevated blood pressure, which she relates to dietary changes while traveling. During the chest pain episodes she had brief shortness of breath and felt very hot, prompting her to remove clothing layers. She denies nausea, dizziness, pain radiating to neck or jaw, abdominal discomfort, or burning or bad taste in her mouth.She notes shortness of breath in cold weather at baseline but denies recent cough or runny nose.MDM  Physical ExamED Triage Vitals [01/11/24 1304]BP: (!) 198/104Pulse: 70Pulse from  O2 sat: n/aResp: 16Temp: 98.3 ?F (36.8 ?C)Temp src: OralSpO2: 99 % BP (!) 169/101  - Pulse 63  - Temp 97.5 ?F (36.4 ?C) (Oral)  - Resp 18  - SpO2 98% Physical ExamPhysical ExamPhysical ExamGEN: Overall well, comfortable appearing. Awake, alert, conversantHENT:  Atraumatic. EYES: No conjunctival pallor, injection, or icterus. EOM grossly intact.NECK: Ranging neck normally, no appreciable deformitiesCV: Regular rate and rhythm. PULM:  Comfortable work of breathing.GU: DeferredABD: Nondistended. MSK: Normal bulk and tone. No reproducible TTP of chest or armSKIN: Warm, dryNEURO: A&O x3, No facial droop or dysarthria. Moving all extremities spontaneously.PSYCH: Normal insight, normal memory Medical Decision Making62 year old female with a history of HTN and CAD presented with intermittent sharp chest pain and left arm tightness that began approximately three hours prior to arrival. She reports no current dyspnea, nausea, or lightheadedness, but did experience hot flashes. She is not currently taking any prescribed cardiac or antihypertensive medications and has recently noted increased blood pressure. Differential diagnosis includes, but is not limited to:- Acute Coronary Syndrome: Intermittent sharp chest pain with left arm tightness in a patient with prior myocardial infarction raises concern for acute coronary syndrome, though the pain is not classic for myocardial infarction and lacks persistent pressure or associated symptoms such as significant dyspnea or nausea.- Hypertensive emergency: possible in s/o elevated BP and intermittent sx- Musculoskeletal Chest Pain: The episodic, stabbing nature of the pain and relief with rest are suggestive of a musculoskeletal etiology, which is being considered as a possible non-cardiac cause.- Pulmonary Etiology: Pulmonary causes of chest pain including pneumonia, less likely PE are considered - pt w/o any PE risk factors, normal vitals and not pleuritic nature of pain so unlikely.Acute chest pain- Order serial cardiac enzymes and EKG- Order chest X-ray- Administer Tylenol  and consider lidocaine patch for pain reliefProceduresAttestation/Critical CareComments as of 01/11/24 1651 Thu Jan 11, 2024 1540 High Sensitivity Troponin T: 8Will trend [EJ] 1622 1 hour Delta from 0 Hour, HS-Troponin T: -2 [EJ] 1622 CXRNo acute cardiothoracic abnormality. [EJ]  Comments User Index[EJ] Vernette Damien Kay, MD Clinical Impressions as of 01/11/24 1651 Chest pain, unspecified type  ED DispositionDischargeI provided a concise overview of the ambient note generation solution. Reena LITTIE Che or  their legally authorized representative verbally consented to a temporary audio recording of their visit to assist with completing the visit documentation using an AI-powered solution. This note was reviewed for accuracy by Damien LITTIE Pander who performed the clinical service. Pander Damien Kay, MD01/08/26 1649 Pander Damien Kay, MD01/08/26 [phone]

## 2024-01-11 NOTE — Discharge Instructions [18]
 You were seen today for your chest pain. You had normal bloodwork, a Chest Xray and an EKG of your heart. You are not having a heart attack, your lungs look safe, and we believe you are safe to go home at this time. Your pain may be from muscle spasms. You can take 1,000mg  (two extra strength) tylenol  every 6-8 hours for pain as needed. You can also use a lidocaine patch to help reduce the pain at a specific area. There is an inexpensive over the counter 4% patch that a pharmacist can help you find (examples: salonpas / aspercreme / vicks).You can keep the patch on for 12 hours but then you must remove it for 12 hours to give your skin a break before applying a new patch. It is important to take all of your medications as prescribed.  Follow up with your primary care doctor to let them know you were seen in the ER, and return to the ER if you develop trouble breathing, fevers > 100.4, trouble moving your arm, or worsening/severe sudden pain that shoots into your back, your neck, or your arm. It was a pleasure caring for you in the emergency department. We have placed a referral to get you connected to a cardiologist.  We also recommend taking the medications that you've been previously prescribed, particularly your blood pressure medication.

## 2024-01-11 NOTE — ED Triage Notes [8]
 Provider in Triage Note44 y.o. year old female with hx CAD  presents with left sided chest painPhysical Exam: hypertensiveOrders placed in triage: yesDisposition: Main Emergency Department for further evaluation.Zoe Morrisette M Hoffman1/08/2024 1:06 PM

## 2024-01-11 NOTE — ED Notes [6]
 2:19 PM Pt to ED for chest pain. Pt reports new onset tightness in left arm / shoulder and left sided anterior CP that started few hours ago. Pt states CP is intermittent and denies CP at this time. Denies fever, chills, GI/GU symptoms.  A&O x4, speaking in clear and full sentences, ambulates ind w steady gait.  Pt laying in stretcher in no apparent resp distress at this time. Pt updated on POC. Past Medical History[1] 2:50 Medication administered per order, see MAR. 5:04 PM Pt cleared for discharge by provider.  Discharge papers and instructions given, pt verbalized understanding.  Vitals obtained and reviewed.  Belongings returned.  All questions answered.  Ambulatory w/ steady gait upon departure.  [1] Past Medical History:Diagnosis Date  Asthma (HC CODE)   Last attack was when she was 53. She takes albuterol prn.  Hypercholesteremia   Hypertension   Radiculopathy   Rheumatoid arthritis (HC Code)    Rheumatoid arthritis (HC Code)    Rheumatoid arthritis involving ankle, unspecified laterality, unspecified rheumatoid factor presence   Rheumatoid arthritis involving ankle, unspecified laterality, unspecified rheumatoid factor presence   STEMI involving left anterior descending coronary artery    Thrombophlebitis   Uterine fibroid

## 2024-01-12 NOTE — Telephone Communication [[id number]]
 01/11/2024     Patient follow-up request: Needs navigation;HTN no PCP Reason for follow-up: needs a cardiologist but doesn't have insurance Request received in ED follow up, call to pt. Pt states she is feeling better than yesterday, had chest pain earlier now resolved. Pt states she can not afford healthcare or previously dicussed sliding scale at [facility], approx 1 year ago , agrees to speak with HTN clinic , will also be provided with phone number for [facility]  as income has decreased and [facility], although unsure if she is a candidate . Secure chat message to HTN clinic, phone numbers sent to her at [email or web address]
